# Patient Record
Sex: Male | Born: 1955 | Race: Black or African American | Hispanic: No | Smoking: Former smoker
Health system: Southern US, Community
[De-identification: ages and names within clinical notes are randomized; demographics above are authoritative.]

## PROBLEM LIST (undated history)

## (undated) HISTORY — PX: OTHER SURGICAL HISTORY: SHX169

---

## 1999-06-30 ENCOUNTER — Emergency Department (HOSPITAL_COMMUNITY): Admission: EM | Admit: 1999-06-30 | Discharge: 1999-06-30 | Payer: Self-pay | Admitting: Emergency Medicine

## 1999-12-23 ENCOUNTER — Encounter (INDEPENDENT_AMBULATORY_CARE_PROVIDER_SITE_OTHER): Payer: Self-pay | Admitting: *Deleted

## 1999-12-23 ENCOUNTER — Ambulatory Visit (HOSPITAL_BASED_OUTPATIENT_CLINIC_OR_DEPARTMENT_OTHER): Admission: RE | Admit: 1999-12-23 | Discharge: 1999-12-23 | Payer: Self-pay | Admitting: General Surgery

## 2002-03-21 ENCOUNTER — Emergency Department (HOSPITAL_COMMUNITY): Admission: EM | Admit: 2002-03-21 | Discharge: 2002-03-21 | Payer: Self-pay | Admitting: Emergency Medicine

## 2002-09-26 ENCOUNTER — Emergency Department (HOSPITAL_COMMUNITY): Admission: EM | Admit: 2002-09-26 | Discharge: 2002-09-26 | Payer: Self-pay | Admitting: Emergency Medicine

## 2003-10-01 ENCOUNTER — Emergency Department (HOSPITAL_COMMUNITY): Admission: EM | Admit: 2003-10-01 | Discharge: 2003-10-01 | Payer: Self-pay

## 2004-03-12 ENCOUNTER — Emergency Department (HOSPITAL_COMMUNITY): Admission: EM | Admit: 2004-03-12 | Discharge: 2004-03-12 | Payer: Self-pay | Admitting: Family Medicine

## 2004-03-12 ENCOUNTER — Emergency Department (HOSPITAL_COMMUNITY): Admission: EM | Admit: 2004-03-12 | Discharge: 2004-03-12 | Payer: Self-pay | Admitting: Emergency Medicine

## 2004-09-07 ENCOUNTER — Emergency Department (HOSPITAL_COMMUNITY): Admission: EM | Admit: 2004-09-07 | Discharge: 2004-09-07 | Payer: Self-pay | Admitting: Emergency Medicine

## 2004-12-27 ENCOUNTER — Emergency Department (HOSPITAL_COMMUNITY): Admission: EM | Admit: 2004-12-27 | Discharge: 2004-12-27 | Payer: Self-pay | Admitting: Emergency Medicine

## 2007-11-16 ENCOUNTER — Emergency Department (HOSPITAL_COMMUNITY): Admission: EM | Admit: 2007-11-16 | Discharge: 2007-11-17 | Payer: Self-pay | Admitting: Emergency Medicine

## 2010-05-19 ENCOUNTER — Emergency Department (HOSPITAL_COMMUNITY): Admission: EM | Admit: 2010-05-19 | Discharge: 2010-05-19 | Payer: Self-pay | Admitting: Emergency Medicine

## 2011-01-21 NOTE — Op Note (Signed)
[place]  Patient:    PAULINE, TRAINER                       MRN: [id number] Proc. Date: 12/23/99 Adm. Date:  [id number] Attending:  Fortino Sic                           Operative Report  PREOPERATIVE DIAGNOSIS:  Scalp mass near ear.  POSTOPERATIVE DIAGNOSIS:  Scalp mass near ear.  OPERATION PERFORMED:  Excision of large scalp mass, probable lipoma, left ear.  SURGEON:  Marnee Spring. Wiliam Ke, M.D.  ASSISTANT:  None.  ANESTHESIA:  Local MAC.  DESCRIPTION OF PROCEDURE:  With the patient well-sedated, the skin of the scalp  near the ear was prepped and draped in the usual manner.  The tissue was infiltrated with a mixture of Xylocaine and Marcaine anesthesia.  An incision was made covering the mass which was 4.5 cm long.  The mass which was felt to be a lipoma was dissected free from soft tissue.  Hemostasis was obtained with electrocautery current.  The wound was then closed with subcutaneous 3-0 Vicryl and subcuticular 4-0 Vicryl.  Steri-Strips were applied.  Estimated blood loss minimal. The patient received no blood and left the operating room in satisfactory condition after sponge and needle counts were verified. DD:  12/23/99 TD:  12/23/99 Job: [id number]

## 2011-05-30 LAB — POCT CARDIAC MARKERS
CKMB, poc: 2.1
Troponin i, poc: <0.05

## 2011-05-30 LAB — CBC
Hemoglobin: 15.1
MCHC: 34
MCV: 86.9
Platelets: 270
RDW: 14
RDW: 14.4
WBC: 6.1

## 2011-05-30 LAB — LIPASE, BLOOD: Lipase: 36

## 2011-05-30 LAB — COMPREHENSIVE METABOLIC PANEL
ALT: 20
AST: 26
Albumin: 4.2
Albumin: 4.3
Alkaline Phosphatase: 80
Calcium: 9.4
Chloride: 103
Creatinine, Ser: 1.12
GFR calc Af Amer: >60
GFR calc non Af Amer: >60
Glucose, Bld: 131 — ABNORMAL HIGH
Potassium: 4.3
Sodium: 138
Total Protein: 7.3

## 2011-05-30 LAB — DIFFERENTIAL
Basophils Relative: 0
Eosinophils Absolute: 0.1
Eosinophils Relative: 1
Lymphocytes Relative: 21
Lymphs Abs: 1.4
Monocytes Absolute: 0.5
Monocytes Absolute: 0.5
Neutro Abs: 4.2

## 2011-05-30 LAB — URINALYSIS, ROUTINE W REFLEX MICROSCOPIC
Bilirubin Urine: NEGATIVE
Nitrite: NEGATIVE
Protein, ur: NEGATIVE
Specific Gravity, Urine: 1.028
Urobilinogen, UA: 1

## 2011-05-30 LAB — URINE MICROSCOPIC-ADD ON

## 2012-03-16 ENCOUNTER — Encounter (HOSPITAL_COMMUNITY): Payer: Self-pay | Admitting: Emergency Medicine

## 2012-03-16 ENCOUNTER — Emergency Department (HOSPITAL_COMMUNITY)
Admission: EM | Admit: 2012-03-16 | Discharge: 2012-03-16 | Disposition: A | Discharge status: Home / Self Care | Payer: Self-pay | Attending: Emergency Medicine | Admitting: Emergency Medicine

## 2012-03-16 DIAGNOSIS — K645 Perianal venous thrombosis: Secondary | ICD-10-CM | POA: Insufficient documentation

## 2012-03-16 DIAGNOSIS — F17200 Nicotine dependence, unspecified, uncomplicated: Secondary | ICD-10-CM | POA: Insufficient documentation

## 2012-03-16 MED ORDER — OXYCODONE-ACETAMINOPHEN 5-325 MG PO TABS
1.0000 | ORAL_TABLET | Freq: Once | ORAL | Status: AC
  Administered 2012-03-16: 1 via ORAL
  Filled 2012-03-16: qty 1

## 2012-03-16 MED ORDER — DOCUSATE SODIUM 100 MG PO CAPS: 100.0000 mg | ORAL_CAPSULE | Freq: Two times a day (BID) | ORAL | Status: AC

## 2012-03-16 NOTE — ED Provider Notes (Signed)
History     CSN: [id number]  Arrival date & time 03/16/12  1135   First MD Initiated Contact with Patient 03/16/12 1230      Chief Complaint  Patient presents with  . Abscess    (Consider location/radiation/quality/duration/timing/severity/associated sxs/prior treatment) HPI History from patient. 56 year old male who presents with possible abscess near his rectum. He states this has been present for about a week now and is gradually worsening and becoming more painful. He has not noticed any drainage from the area. States the area seems to be increasing in size it makes it difficult to have a bowel movement. He has not had any fever or chills. He has never had anything like this before. No history of abscesses which have required drainage in the past. Denies abdominal pain, nausea, vomiting, diarrhea.    History reviewed. No pertinent past medical history.  History reviewed. No pertinent past surgical history.  History reviewed. No pertinent family history.  History  Substance Use Topics  . Smoking status: Current Everyday Smoker  . Smokeless tobacco: Not on file  . Alcohol Use: Yes      Review of Systems  Constitutional: Negative.   Gastrointestinal: Negative for nausea, vomiting, abdominal pain and diarrhea.  Skin: Positive for wound.    Allergies  Penicillins  Home Medications  No current outpatient prescriptions on file.  BP 108/68  Pulse 66  Temp 98.3 F (36.8 C) (Oral)  Resp 18  SpO2 97%  Physical Exam  Nursing note and vitals reviewed. Constitutional: He appears well-developed and well-nourished. No distress.  HENT:  Head: Normocephalic and atraumatic.  Neck: Normal range of motion.  Cardiovascular: Normal rate.   Pulmonary/Chest: Effort normal.  Abdominal: Soft. Bowel sounds are normal. There is no tenderness. There is no rebound and no guarding.  Genitourinary:       RN chaperone present during exam. Large thrombosed external hemorrhoid at  5:00. No active bleeding or discharge noted. Area is exquisitely tender to palpation.  Musculoskeletal: Normal range of motion.  Neurological: He is alert.  Skin: Skin is warm and dry. He is not diaphoretic.  Psychiatric: He has a normal mood and affect.    ED Course  Procedures (including critical care time) INCISION AND DRAINAGE Performed by: Grant Fontana Consent: Verbal consent obtained. Risks and benefits: risks, benefits and alternatives were discussed Type: Thrombosed hemorrhoid   Body area: Rectum  Anesthesia: local infiltration  Local anesthetic: lidocaine 2 % with epinephrine  Anesthetic total: 1.5 ml  Complexity: complex Blunt dissection to break up loculations  Drainage: Bloody with clots   Drainage amount: Moderate   Patient tolerance: Patient tolerated the procedure well with no immediate complications.     Labs Reviewed - No data to display No results found.   1. Thrombosed external hemorrhoid       MDM  Patient presents with what appears to be a large thrombosed external hemorrhoid. The area was incised and drained which the patient tolerated relatively well. He was instructed on sitz baths, and using stool softeners to prevent recurrence/new hemorrhoids. Reasons to return discussed. Patient verbalized understanding and agreed to plan.         Grant Fontana, [place] 03/17/12 [phone]

## 2012-03-16 NOTE — ED Notes (Signed)
Pt states he has a abscess between is buttock. Pt states he has had it for about a week and yesterday is when the pain increased. Pt states currently is pain is a 4. Pt states " It feels hard and its pretty big". Pt states he has not taken anything for pain. Pt states "I don"t have any problems urinating but difficulty when I have a bowel movement because it's kinda over my rectum.

## 2012-03-16 NOTE — ED Notes (Signed)
Pt c/o rectal abscess; pt unsure of exact location but thinks drainage coming from rectum; pt having pain

## 2012-03-17 NOTE — ED Provider Notes (Signed)
Medical screening examination/treatment/procedure(s) were performed by non-physician practitioner and as supervising physician I was immediately available for consultation/collaboration.  Doug Sou, MD 03/17/12 [phone]

## 2014-09-17 ENCOUNTER — Emergency Department (HOSPITAL_COMMUNITY): Payer: Self-pay

## 2014-09-17 ENCOUNTER — Encounter (HOSPITAL_COMMUNITY): Payer: Self-pay | Admitting: Emergency Medicine

## 2014-09-17 ENCOUNTER — Emergency Department (HOSPITAL_COMMUNITY)
Admission: EM | Admit: 2014-09-17 | Discharge: 2014-09-17 | Disposition: A | Discharge status: Home / Self Care | Payer: Self-pay | Attending: Emergency Medicine | Admitting: Emergency Medicine

## 2014-09-17 DIAGNOSIS — J209 Acute bronchitis, unspecified: Secondary | ICD-10-CM | POA: Insufficient documentation

## 2014-09-17 DIAGNOSIS — Z720 Tobacco use: Secondary | ICD-10-CM | POA: Insufficient documentation

## 2014-09-17 LAB — CBG MONITORING, ED: GLUCOSE-CAPILLARY: 81 mg/dL (ref 70–99)

## 2014-09-17 MED ORDER — FLUTICASONE PROPIONATE 50 MCG/ACT NA SUSP: 2.0000 | Freq: Every day | NASAL | Status: DC

## 2014-09-17 MED ORDER — AZITHROMYCIN 250 MG PO TABS: ORAL_TABLET | ORAL | Status: DC

## 2014-09-17 NOTE — ED Notes (Signed)
Pt states when he coughs he gets a pain in his chest. Denies pain unless coughing

## 2014-09-17 NOTE — ED Notes (Signed)
CBG 81.  

## 2014-09-17 NOTE — Discharge Instructions (Signed)
Use nasal saline (you can try Arm and Hammer Simply Saline) at least 4 times a day, use saline 5-10 minutes before using the fluticasone (flonase)  Do not use Afrin (Oxymetazoline)  Rest, wash hands frequently  and drink plenty of water.  You may want to try an over-the-counter medication like sudafed and or mucinex  Do not hesitate to return to the emergency room for any new, worsening or concerning symptoms.  Please obtain primary care using resource guide below. But the minute you were seen in the emergency room and that they will need to obtain records for further outpatient management.   Acute Bronchitis Bronchitis is inflammation of the airways that extend from the windpipe into the lungs (bronchi). The inflammation often causes mucus to develop. This leads to a cough, which is the most common symptom of bronchitis.  In acute bronchitis, the condition usually develops suddenly and goes away over time, usually in a couple weeks. Smoking, allergies, and asthma can make bronchitis worse. Repeated episodes of bronchitis may cause further lung problems.  CAUSES Acute bronchitis is most often caused by the same virus that causes a cold. The virus can spread from person to person (contagious) through coughing, sneezing, and touching contaminated objects. SIGNS AND SYMPTOMS   Cough.   Fever.   Coughing up mucus.   Body aches.   Chest congestion.   Chills.   Shortness of breath.   Sore throat.  DIAGNOSIS  Acute bronchitis is usually diagnosed through a physical exam. Your health care provider will also ask you questions about your medical history. Tests, such as chest X-rays, are sometimes done to rule out other conditions.  TREATMENT  Acute bronchitis usually goes away in a couple weeks. Oftentimes, no medical treatment is necessary. Medicines are sometimes given for relief of fever or cough. Antibiotic medicines are usually not needed but may be prescribed in certain  situations. In some cases, an inhaler may be recommended to help reduce shortness of breath and control the cough. A cool mist vaporizer may also be used to help thin bronchial secretions and make it easier to clear the chest.  HOME CARE INSTRUCTIONS  Get plenty of rest.   Drink enough fluids to keep your urine clear or pale yellow (unless you have a medical condition that requires fluid restriction). Increasing fluids may help thin your respiratory secretions (sputum) and reduce chest congestion, and it will prevent dehydration.   Take medicines only as directed by your health care provider.  If you were prescribed an antibiotic medicine, finish it all even if you start to feel better.  Avoid smoking and secondhand smoke. Exposure to cigarette smoke or irritating chemicals will make bronchitis worse. If you are a smoker, consider using nicotine gum or skin patches to help control withdrawal symptoms. Quitting smoking will help your lungs heal faster.   Reduce the chances of another bout of acute bronchitis by washing your hands frequently, avoiding people with cold symptoms, and trying not to touch your hands to your mouth, nose, or eyes.   Keep all follow-up visits as directed by your health care provider.  SEEK MEDICAL CARE IF: Your symptoms do not improve after 1 week of treatment.  SEEK IMMEDIATE MEDICAL CARE IF:  You develop an increased fever or chills.   You have chest pain.   You have severe shortness of breath.  You have bloody sputum.   You develop dehydration.  You faint or repeatedly feel like you are going to pass out.  You develop repeated vomiting.  You develop a severe headache. MAKE SURE YOU:   Understand these instructions.  Will watch your condition.  Will get help right away if you are not doing well or get worse. Document Released: 09/29/2004 Document Revised: 01/06/2014 Document Reviewed: 02/12/2013 [facility] Patient Information 2015  [place], [place]. This information is not intended to replace advice given to you by your health care provider. Make sure you discuss any questions you have with your health care provider.   Emergency Department Resource Guide 1) Find a Doctor and Pay Out of Pocket Although you won't have to find out who is covered by your insurance plan, it is a good idea to ask around and get recommendations. You will then need to call the office and see if the doctor you have chosen will accept you as a new patient and what types of options they offer for patients who are self-pay. Some doctors offer discounts or will set up payment plans for their patients who do not have insurance, but you will need to ask so you aren't surprised when you get to your appointment.  2) Contact Your Local Health Department Not all health departments have doctors that can see patients for sick visits, but many do, so it is worth a call to see if yours does. If you don't know where your local health department is, you can check in your phone book. The CDC also has a tool to help you locate your state's health department, and many state websites also have listings of all of their local health departments.  3) Find a Walk-in Clinic If your illness is not likely to be very severe or complicated, you may want to try a walk in clinic. These are popping up all over the country in pharmacies, drugstores, and shopping centers. They're usually staffed by nurse practitioners or physician assistants that have been trained to treat common illnesses and complaints. They're usually fairly quick and inexpensive. However, if you have serious medical issues or chronic medical problems, these are probably not your best option.  No Primary Care Doctor: - Call Health Connect at  [phone] - they can help you locate a primary care doctor that  accepts your insurance, provides certain services, etc. - Physician Referral Service- [phone]  Chronic Pain  Problems: Organization         Address  Phone   Notes  [facility]  [phone] Patients need to be referred by their primary care doctor.   Medication Assistance: Organization         Address  Phone   Notes  [facility] Medication [facility] [address] [place] [place], [place] [address] [phone] --Must be a resident of [facility] -- Must have NO insurance coverage whatsoever (no Medicaid/ Medicare, etc.) -- The pt. MUST have a primary care doctor that directs their care regularly and follows them in the community   [facility]ist  [phone]   [employer]  [phone]    Agencies that provide inexpensive medical care: Organization         Address  Phone   Notes  [facility]  [phone]   [facility]    [phone]   [facility] [address] [place], [place] [address] [phone]   [facility] [address] [place]. [address] [place] [phone]   Planned Parenthood    [phone]   [facility]    [phone]   [facility]  [address], [place] Phone:  [phone], Fax:  [phone] Hours of Operation:  9 am - 6 pm, M-F.  Also accepts Medicaid/Medicare and self-pay.  [facility]  [address], [place] Phone: [phone], Fax: [phone]. Hours of Operation:  8:30 am - 5:30 pm, M-F.  Also accepts Medicaid and self-pay.  [facility] [place] [address], [place] Phone: [phone]   [facility] [address] Natasha Bence [place], [place] [phone], Ext. [phone] Mondays & Thursdays: 7-9 AM.  First 15 patients are seen on a first come, first serve basis.    Medicaid-accepting [facility] Providers:  Organization         Address  Phone   Notes  [facility] [address], [place] [phone] Also  accepts self-pay patients.  [facility] [address] Laurell Josephs [place], [place]  [phone]   [facility] [address], [place] [phone]   [facility] [address], [place] [phone]   Renaye Rakers [address], [place]   [phone] Only accepts [place] patients after they have their name applied to their card.   Self-Pay (no insurance) in [facility]:  Organization         Address  Phone   Notes  Sickle Cell Patients, [facility] [address] [place], [place] [phone]   [facility] [address] [place], [place] [phone]   [facility]  [address], [place] [phone]   [facility]/Dr. Osei-Bonsu  [address] [place] or [address], [place] [phone] Phone number for both [place] and [place] locations is the same.  [facility] [address] [place] [phone]   [facility] [address] [place] or [address] [phone]   [facility] [address] [place] [phone], phone; [phone], fax Sees patients 1st and 3rd Saturday of every month.  Must not qualify for public or private insurance (i.e. Medicaid, Medicare, [place] Health Choice, Veterans' Benefits)  Household income should be no more than 200% of the poverty level The clinic cannot treat you if you are pregnant or think you are pregnant  Sexually transmitted diseases are not treated at the clinic.    Dental Care: Organization         Address  Phone  Notes  [facility] [address] [place], [place] [phone] Accepts children up to age 67 who are enrolled in [place] or [place] Health Choice; pregnant  women with a Medicaid card; and children who have applied for Medicaid or [place] Health Choice, but were declined, whose parents can pay a reduced fee at time of service.  [facility] of [facility]  [address], [place] [phone] Accepts children up to age 88 who are enrolled in [place] or [place] Health Choice; pregnant women with a Medicaid card; and children who have applied for Medicaid or [place] Health Choice, but were declined, whose parents can pay a reduced fee at time of  service.  [facility]  [address] [place], [place] [phone] Patients are seen by appointment only. Walk-ins are not accepted. [facility] will see patients 42 years of age and older. Monday - Tuesday (8am-5pm) Most Wednesdays (8:30-5pm) $30 per visit, cash only  [facility] Adult [facility] Access PRO[facility]  [address], [facility] [phone] Patients are seen by appointment only. Walk-ins are not accepted. [facility] will see patients 38 years of age and older. One Wednesday Evening (Monthly: Volunteer Based).  $30 per visit, cash only  [employer]  [phone] for adults; Children under age 74, call Graduate Pediatric Dentistry at [phone]. Children aged 39-14, please call [phone] to request a pediatric application.  Dental services are provided in all areas of dental care including fillings, crowns and bridges, complete and partial dentures, implants, gum treatment, root canals, and extractions. Preventive care is also provided. Treatment is provided to both adults and children. Patients are selected via a lottery and there is often a waiting list.   [facility] [address] [place]  [phone] [email or web address]   [facility] [address] [place], [place] [phone], Ext. [phone] Second and Fourth Thursday of each month, opens at 6:30 AM; Clinic ends at 9 AM.  Patients are  seen on a first-come first-served basis, and a limited number are seen during each clinic.   [facility]  [address] Ether Griffins [place], [place] [phone]   Eligibility Requirements You must have lived in [place], [place], or [place] counties for at least the last three months.   You cannot be eligible for state or federal sponsored [place], including [employer], [place], or [employer].   You generally cannot be eligible for healthcare insurance through your employer.    How to apply: Eligibility screenings are held every Tuesday and Wednesday afternoon from 1:00 pm until 4:00 pm. You do not need an appointment for the interview!  [facility] [address], [place], [place] [phone]   [facility] Health Department  [phone]   [facility] Health Department  [phone]   [facility] Health Department  [phone]    Behavioral Health Resources in the Community: Intensive Outpatient Programs Organization         Address  Phone  Notes  [facility] [address] [place], [place] [phone]   [facility] Outpatient [address], [place], [place] [phone]   [facility] [address], [place], [place]  [phone]   [facility] Mental Health [address]  [place], [place] [phone] or [phone]   Substance Abuse Resources Organization         Address  Phone  Notes  [facility] Drug Services  [phone]   [facility]  [phone]   [facility] [place]  [phone]   Floydene Flock  [phone]   Residential & Outpatient Substance Abuse Program  [phone]   Psychological Services Organization         Address  Phone  Notes  [facility] Behavioral Health  [phone]   [facility]  [phone]   [facility] Mental Health [address], [place] [phone] or [phone]    Mobile Crisis  Teams Organization         Address  Phone  Notes  [facility], Mobile Crisis Care Unit  [phone]   [facility]  [address] [place], [place] [phone]   [facility]sch [address] [place] [phone]    Self-Help/Support Groups Organization         Address  Phone             Notes  [facility] [place] - variety of support groups  336- [phone] Call for more information  Narcotics Anonymous (NA), [facility] [address], [place]  2 meetings at this location   Statisticianesidential Treatment Programs Organization         Address  Phone  Notes  [facility] [address],    [place]  [phone]   [facility]  [address], [place] [phone], [place], [place] [phone]   [facility] [address], [place] [phone] Admissions: 8am-3pm M-F  [facility] [address]    [place], [place] [phone]   [facility] [address], [place], [place] [phone]   [facility] [address]  [place], [place] [phone]   [facility] - Intensive Outpatient [address], [place], [place] [phone]   [facility] ([facility] [address]  [place], [place] [phone] or [phone]   [facility] ([facility]) [address] [place], [place] [phone] Accepts Medicaid  [facility] [address]  [place] [phone] Substance Abuse/Addiction Treatment   [facility] Behavioral Health Resources Organization         Address  Phone  Notes  [facility]  [phone]) [phone]   Angie FavaJulie Brannon, PhD [address] [place], [place]   [phone](336) [phone] or [phone]   [facility]    [address] [place], [place] [phone]) [phone]   [facility] [address], [place], [place] [phone](336) [phone]  Insurance/Medicaid/sponsorship through [facility]  [facility] [address]                                    [place], [place] [phone]) [phone] Therapy/tele-psych/case  [facility] [address]   [place], [place] [phone]    Dr. Lolly MustacheArfeen  [phone]   [facility] [place]  [employer] [facility] 1) [address], [place] 2) [address], [place] 3)  [address], [place] [phone]) [phone](336) [phone]  [phone]   [facility] Child Abuse Hotline [phone]) [phone] or [phone] (After Hours)

## 2014-09-17 NOTE — ED Notes (Signed)
Pt sts pain in right side with cough and requests CBG check

## 2014-09-17 NOTE — ED Provider Notes (Signed)
CSN: [id number]     Arrival date & time 09/17/14  1451 History  This chart was scribed for non-physician practitioner working with Thomas SouSam Jacubowitz, MD by Thomas Hinton, ED Scribe. This patient was seen in room TR09C/TR09C and the patient's care was started at 4:46 PM.    Chief Complaint  Patient presents with  . Cough   The history is provided by the patient. No language interpreter was used.   HPI Comments: Thomas Hinton is a 59 y.o. male who presents to the Emergency Department complaining of a dry cough that started 3 days ago. He states that his cough was productive with phlegm at onset 3 days ago. He states that he coughs more frequently at night. Pt reports associated rhinorrhea and congestion. Pt states that his cough started to cause him CP. He denies any pain unless he is coughing. Pt reports he smoked 1ppd for 20 years but quit a year ago. Denies fever, chills and SOB.    Pt also wants to get his checked to see if he has diabetes because he reports a family history of diabetes.   CBG is not coming through on computer. CBG is 81.   History reviewed. No pertinent past medical history. History reviewed. No pertinent past surgical history. History reviewed. No pertinent family history. History  Substance Use Topics  . Smoking status: Current Every Day Smoker  . Smokeless tobacco: Not on file  . Alcohol Use: Yes    Review of Systems  A complete 10 system review of systems was obtained and all systems are negative except as noted in the HPI and PMH.    Allergies  Penicillins  Home Medications   Prior to Admission medications   Not on File   BP 111/69 mmHg  Pulse 64  Temp(Src) 97.9 F (36.6 C)  Resp 16  SpO2 98% Physical Exam  Constitutional: He is oriented to person, place, and time. He appears well-developed and well-nourished.  HENT:  Head: Normocephalic and atraumatic.  Mouth/Throat: Oropharynx is clear and moist.  No drooling or stridor. Posterior pharynx  mildly erythematous no significant tonsillar hypertrophy. No exudate. Soft palate rises symmetrically. No TTP or induration under tongue.   No tenderness to palpation of frontal or bilateral maxillary sinuses.  No mucosal edema in the nares.  Bilateral tympanic membranes with normal architecture and good light reflex.    Eyes: Pupils are equal, round, and reactive to light. Right eye exhibits no discharge. Left eye exhibits no discharge.  Neck: Normal range of motion. Neck supple. No tracheal deviation present.  Cardiovascular: Normal rate, regular rhythm and intact distal pulses.   Pulmonary/Chest: Effort normal and breath sounds normal. No respiratory distress. He has no wheezes. He has no rales. He exhibits no tenderness.  Abdominal: Soft. Bowel sounds are normal. He exhibits no distension and no mass. There is no tenderness. There is no rebound and no guarding.  Neurological: He is alert and oriented to person, place, and time.  Skin: Skin is warm and dry.  Psychiatric: He has a normal mood and affect. His behavior is normal.  Nursing note and vitals reviewed.   ED Course  Procedures   DIAGNOSTIC STUDIES: Oxygen Saturation is 98% on RA, normal by my interpretation.    COORDINATION OF CARE: 4:49 PM Discussed treatment plan with pt at bedside and pt agreed to plan.   Labs Review Labs Reviewed  CBG MONITORING, ED    Imaging Review Dg Chest 2 View (if Patient Has Fever And/or  Copd)  09/17/2014   CLINICAL DATA:  Cough for 2-3 days  EXAM: CHEST  2 VIEW  COMPARISON:  11/16/2007  FINDINGS: The heart size and mediastinal contours are within normal limits. Both lungs are clear. Thoracic scoliosis deformity noted.  IMPRESSION: No active cardiopulmonary disease.   Electronically Signed   By: Thomas Hinton M.D.   On: 09/17/2014 15:32     EKG Interpretation None      MDM   Final diagnoses:  Acute bronchitis, unspecified organism    Filed Vitals:   09/17/14 1507 09/17/14 1707   BP: 111/69 103/75  Pulse: 64 66  Temp: 97.9 F (36.6 C) 97.8 F (36.6 C)  TempSrc:  Oral  Resp: 16 16  SpO2: 98% 98%    Nole Robey is a pleasant 59 y.o. male presenting with cough and pleuritic chest pain. States the cough was initially productive but then it dried. Patient has an extensive smoking history, lung sounds are clear to auscultation, saturating well on room air, chest x-rays without infiltrate, we'll treat him for an acute bronchitis and considering he is a smoker with sputum change will start him on antibiotics as well.  Evaluation does not show pathology that would require ongoing emergent intervention or inpatient treatment. Pt is hemodynamically stable and mentating appropriately. Discussed findings and plan with patient/guardian, who agrees with care plan. All questions answered. Return precautions discussed and outpatient follow up given.   Discharge Medication List as of 09/17/2014  5:03 PM    START taking these medications   Details  azithromycin (ZITHROMAX Z-PAK) 250 MG tablet 2 po day one, then 1 daily x 4 days, Print    fluticasone (FLONASE) 50 MCG/ACT nasal spray Place 2 sprays into both nostrils daily., Starting 09/17/2014, Until Discontinued, Print        I personally performed the services described in this documentation, which was scribed in my presence. The recorded information has been reviewed and is accurate.      Thomas Emery, PA-C 09/17/14 [id number]  Thomas Sou, MD 09/18/14 [phone]

## 2014-09-18 LAB — CBG MONITORING, ED: GLUCOSE-CAPILLARY: 82 mg/dL (ref 70–99)

## 2015-09-08 ENCOUNTER — Encounter (HOSPITAL_COMMUNITY): Payer: Self-pay | Admitting: Emergency Medicine

## 2015-09-08 ENCOUNTER — Emergency Department (HOSPITAL_COMMUNITY)
Admission: EM | Admit: 2015-09-08 | Discharge: 2015-09-08 | Disposition: A | Discharge status: Home / Self Care | Payer: Self-pay | Attending: Emergency Medicine | Admitting: Emergency Medicine

## 2015-09-08 DIAGNOSIS — Z880 Allergy status to penicillin: Secondary | ICD-10-CM | POA: Insufficient documentation

## 2015-09-08 DIAGNOSIS — F17210 Nicotine dependence, cigarettes, uncomplicated: Secondary | ICD-10-CM | POA: Insufficient documentation

## 2015-09-08 DIAGNOSIS — K2970 Gastritis, unspecified, without bleeding: Secondary | ICD-10-CM | POA: Insufficient documentation

## 2015-09-08 LAB — CBC
HCT: 46.6 % (ref 39.0–52.0)
HEMOGLOBIN: 16 g/dL (ref 13.0–17.0)
MCH: 29.6 pg (ref 26.0–34.0)
MCHC: 34.3 g/dL (ref 30.0–36.0)
MCV: 86.3 fL (ref 78.0–100.0)
PLATELETS: 258 10*3/uL (ref 150–400)
RBC: 5.4 MIL/uL (ref 4.22–5.81)
RDW: 14.4 % (ref 11.5–15.5)
WBC: 6.5 10*3/uL (ref 4.0–10.5)

## 2015-09-08 LAB — URINE MICROSCOPIC-ADD ON: WBC UA: NONE SEEN WBC/hpf (ref 0–5)

## 2015-09-08 LAB — COMPREHENSIVE METABOLIC PANEL
ALBUMIN: 3.5 g/dL (ref 3.5–5.0)
ALK PHOS: 68 U/L (ref 38–126)
ALT: 26 U/L (ref 17–63)
ANION GAP: 9 (ref 5–15)
AST: 33 U/L (ref 15–41)
BILIRUBIN TOTAL: 1.2 mg/dL (ref 0.3–1.2)
BUN: 7 mg/dL (ref 6–20)
CALCIUM: 9.1 mg/dL (ref 8.9–10.3)
CO2: 25 mmol/L (ref 22–32)
CREATININE: 1.12 mg/dL (ref 0.61–1.24)
Chloride: 107 mmol/L (ref 101–111)
GFR calc Af Amer: >60 mL/min (ref >60)
GFR calc non Af Amer: >60 mL/min (ref >60)
GLUCOSE: 108 mg/dL — AB (ref 65–99)
Potassium: 3.9 mmol/L (ref 3.5–5.1)
Sodium: 141 mmol/L (ref 135–145)
TOTAL PROTEIN: 6.3 g/dL — AB (ref 6.5–8.1)

## 2015-09-08 LAB — URINALYSIS, ROUTINE W REFLEX MICROSCOPIC
BILIRUBIN URINE: NEGATIVE
Glucose, UA: NEGATIVE mg/dL
KETONES UR: 40 mg/dL — AB
Leukocytes, UA: NEGATIVE
NITRITE: NEGATIVE
PROTEIN: NEGATIVE mg/dL
Specific Gravity, Urine: 1.023 (ref 1.005–1.030)
pH: 6 (ref 5.0–8.0)

## 2015-09-08 LAB — LIPASE, BLOOD: Lipase: 36 U/L (ref 11–51)

## 2015-09-08 MED ORDER — OMEPRAZOLE 20 MG PO CPDR: 20.0000 mg | DELAYED_RELEASE_CAPSULE | Freq: Every day | ORAL | Status: DC

## 2015-09-08 MED ORDER — ONDANSETRON HCL 4 MG PO TABS
4.0000 mg | ORAL_TABLET | Freq: Once | ORAL | Status: AC
  Administered 2015-09-08: 4 mg via ORAL
  Filled 2015-09-08: qty 1

## 2015-09-08 MED ORDER — ONDANSETRON HCL 4 MG PO TABS: 4.0000 mg | ORAL_TABLET | Freq: Four times a day (QID) | ORAL | Status: DC

## 2015-09-08 MED ORDER — GI COCKTAIL ~~LOC~~
30.0000 mL | Freq: Once | ORAL | Status: AC
  Administered 2015-09-08: 30 mL via ORAL
  Filled 2015-09-08: qty 30

## 2015-09-08 NOTE — Discharge Instructions (Signed)

## 2015-09-08 NOTE — ED Notes (Signed)
Pt states for the last 2 days has had intermittent left mid abd pain that comes and goes. Pt states he vomittied once this am. Denies and diarrhea.

## 2015-09-08 NOTE — ED Provider Notes (Signed)
CSN: [id number]     Arrival date & time 09/08/15  1623 History  By signing my name below, I, Thomas Hinton, attest that this documentation has been prepared under the direction and in the presence of [employer], PA-C. Electronically Signed: Placido Hinton, ED Scribe. 09/08/2015. 6:21 PM.   Chief Complaint  Patient presents with  . Abdominal Pain   The history is provided by the patient. No language interpreter was used.    HPI Comments: Thomas Hinton is a 60 y.o. male who presents to the Emergency Department complaining of intermittent, moderate, epigastric pain with onset 3 days ago. He notes associated nausea, 2x vomiting, mild dizziness and intermittent paraesthesia in his toes. He notes 1 BM this morning which was less than nml. Pt notes worsening pain during and immediately following eating. Pt denies taking any medications for his symptoms. He denies a hx of DM. Pt denies diarrhea, fever, chills, CP, SOB, urinary issues and diaphoresis.   PCP: None  History reviewed. No pertinent past medical history. History reviewed. No pertinent past surgical history. No family history on file. Social History  Substance Use Topics  . Smoking status: Current Every Day Smoker -- 0.50 packs/day    Types: Cigarettes  . Smokeless tobacco: None  . Alcohol Use: Yes    Review of Systems  All other systems reviewed and are negative.   Allergies  Penicillins  Home Medications   Prior to Admission medications   Medication Sig Start Date End Date Taking? Authorizing Provider  aspirin 325 MG tablet Take 325 mg by mouth every 6 (six) hours as needed for mild pain.   Yes Historical Provider, MD  azithromycin (ZITHROMAX Z-PAK) 250 MG tablet 2 po day one, then 1 daily x 4 days Patient not taking: Reported on 09/08/2015 09/17/14   Joni Reining Pisciotta, PA-C  fluticasone (FLONASE) 50 MCG/ACT nasal spray Place 2 sprays into both nostrils daily. Patient not taking: Reported on 09/08/2015 09/17/14   Joni Reining  Pisciotta, PA-C  omeprazole (PRILOSEC) 20 MG capsule Take 1 capsule (20 mg total) by mouth daily. 09/08/15   Tinnie Gens Paz Winsett, PA-C  ondansetron (ZOFRAN) 4 MG tablet Take 1 tablet (4 mg total) by mouth every 6 (six) hours. 09/08/15   Ysabelle Goodroe, PA-C   BP 120/74 mmHg  Pulse 62  Temp(Src) 98.1 F (36.7 C) (Oral)  Resp 16  Ht 5\' 9"  (1.753 m)  Wt 86.183 kg  BMI 28.05 kg/m2  SpO2 94%    Physical Exam  Constitutional: He is oriented to person, place, and time. He appears well-developed and well-nourished.  HENT:  Head: Normocephalic and atraumatic.  Mouth/Throat: No oropharyngeal exudate.  Neck: Normal range of motion.  Cardiovascular: Normal rate.   Pulmonary/Chest: Effort normal. No respiratory distress.  Abdominal: Soft. He exhibits no distension and no mass. There is tenderness. There is no rebound and no guarding.  Very minimal TTP of epigastric region  Musculoskeletal: Normal range of motion.  Neurological: He is alert and oriented to person, place, and time.  Skin: Skin is warm and dry. He is not diaphoretic.  Psychiatric: He has a normal mood and affect. His behavior is normal.  Nursing note and vitals reviewed.   ED Course  Procedures  DIAGNOSTIC STUDIES: Oxygen Saturation is 94% on RA, adequate by my interpretation.    COORDINATION OF CARE: 6:17 PM Pt presents today due to abd pain. Discussed next steps including Zofran, GI cocktail and reevaluation. Pt understood and is agreeable to the plan.   Labs Review  Labs Reviewed  COMPREHENSIVE METABOLIC PANEL - Abnormal; Notable for the following:    Glucose, Bld 108 (*)    Total Protein 6.3 (*)    All other components within normal limits  URINALYSIS, ROUTINE W REFLEX MICROSCOPIC (NOT AT [facility]) - Abnormal; Notable for the following:    Hgb urine dipstick SMALL (*)    Ketones, ur 40 (*)    All other components within normal limits  URINE MICROSCOPIC-ADD ON - Abnormal; Notable for the following:    Squamous Epithelial /  LPF 0-5 (*)    Bacteria, UA RARE (*)    All other components within normal limits  LIPASE, BLOOD  CBC    Imaging Review No results found. I have personally reviewed and evaluated these lab results as part of my medical decision-making.   EKG Interpretation None      MDM   Final diagnoses:  Gastritis    Labs: UA, urine microscopic, lipase, CMP and CBC  Imaging: none indicated  Consults: none  Therapeutics: Zofran, GI cocktail   Assessment:  Plan: Patient's presentation most consistent with gastritis. Symptoms improved with GI cocktail. No significant abdominal tenderness, patient is afebrile, nontoxic with reassuring vital signs. Patient will be discharged home on Prilosec, primary care follow-up. he Pt given strict return precautions, verbalized understanding and agreement to today's plan and had no further questions or concerns at the time of discharge.    I personally performed the services described in this documentation, which was scribed in my presence. The recorded information has been reviewed and is accurate.    Eyvonne MechanicJeffrey Jernee Murtaugh, PA-C 09/08/15 [phone]  Benjiman CoreNathan Pickering, MD 09/08/15 2312

## 2017-01-05 ENCOUNTER — Encounter (HOSPITAL_COMMUNITY): Payer: Self-pay

## 2017-01-05 ENCOUNTER — Emergency Department (HOSPITAL_COMMUNITY): Payer: Self-pay

## 2017-01-05 ENCOUNTER — Inpatient Hospital Stay (HOSPITAL_COMMUNITY)
Admission: EM | Admit: 2017-01-05 | Discharge: 2017-01-07 | DRG: 313 | Disposition: A | Discharge status: Home / Self Care | Payer: Self-pay | Attending: Internal Medicine | Admitting: Internal Medicine

## 2017-01-05 DIAGNOSIS — Z8249 Family history of ischemic heart disease and other diseases of the circulatory system: Secondary | ICD-10-CM

## 2017-01-05 DIAGNOSIS — Z880 Allergy status to penicillin: Secondary | ICD-10-CM

## 2017-01-05 DIAGNOSIS — R9439 Abnormal result of other cardiovascular function study: Secondary | ICD-10-CM | POA: Diagnosis not present

## 2017-01-05 DIAGNOSIS — R079 Chest pain, unspecified: Secondary | ICD-10-CM | POA: Diagnosis present

## 2017-01-05 DIAGNOSIS — F17210 Nicotine dependence, cigarettes, uncomplicated: Secondary | ICD-10-CM | POA: Diagnosis present

## 2017-01-05 DIAGNOSIS — R030 Elevated blood-pressure reading, without diagnosis of hypertension: Secondary | ICD-10-CM | POA: Diagnosis present

## 2017-01-05 DIAGNOSIS — R0789 Other chest pain: Principal | ICD-10-CM | POA: Diagnosis present

## 2017-01-05 DIAGNOSIS — R109 Unspecified abdominal pain: Secondary | ICD-10-CM | POA: Diagnosis present

## 2017-01-05 LAB — BASIC METABOLIC PANEL
ANION GAP: 5 (ref 5–15)
BUN: 15 mg/dL (ref 6–20)
CHLORIDE: 111 mmol/L (ref 101–111)
CO2: 25 mmol/L (ref 22–32)
Calcium: 8.2 mg/dL — ABNORMAL LOW (ref 8.9–10.3)
Creatinine, Ser: 1.08 mg/dL (ref 0.61–1.24)
GFR calc Af Amer: >60 mL/min (ref >60)
GFR calc non Af Amer: >60 mL/min (ref >60)
GLUCOSE: 85 mg/dL (ref 65–99)
POTASSIUM: 3.9 mmol/L (ref 3.5–5.1)
Sodium: 141 mmol/L (ref 135–145)

## 2017-01-05 LAB — CBC
HCT: 41.5 % (ref 39.0–52.0)
HEMOGLOBIN: 14.2 g/dL (ref 13.0–17.0)
MCH: 28.6 pg (ref 26.0–34.0)
MCHC: 34.2 g/dL (ref 30.0–36.0)
MCV: 83.5 fL (ref 78.0–100.0)
Platelets: 283 10*3/uL (ref 150–400)
RBC: 4.97 MIL/uL (ref 4.22–5.81)
RDW: 14.2 % (ref 11.5–15.5)
WBC: 5.9 10*3/uL (ref 4.0–10.5)

## 2017-01-05 LAB — I-STAT TROPONIN, ED: TROPONIN I, POC: 0 ng/mL (ref 0.00–0.08)

## 2017-01-05 LAB — TROPONIN I: Troponin I: <0.03 ng/mL (ref <0.03)

## 2017-01-05 LAB — LIPASE, BLOOD: LIPASE: 38 U/L (ref 11–51)

## 2017-01-05 LAB — TSH: TSH: 2.642 u[IU]/mL (ref 0.350–4.500)

## 2017-01-05 MED ORDER — ASPIRIN EC 325 MG PO TBEC
325.0000 mg | DELAYED_RELEASE_TABLET | Freq: Every day | ORAL | Status: DC
  Administered 2017-01-06 – 2017-01-07 (×2): 325 mg via ORAL
  Filled 2017-01-05 (×2): qty 1

## 2017-01-05 MED ORDER — MORPHINE SULFATE (PF) 4 MG/ML IV SOLN: 2.0000 mg | INTRAVENOUS | Status: DC | PRN

## 2017-01-05 MED ORDER — ACETAMINOPHEN 325 MG PO TABS: 650.0000 mg | ORAL_TABLET | ORAL | Status: DC | PRN

## 2017-01-05 MED ORDER — HYDRALAZINE HCL 20 MG/ML IJ SOLN: 5.0000 mg | Freq: Four times a day (QID) | INTRAMUSCULAR | Status: DC | PRN

## 2017-01-05 MED ORDER — ENOXAPARIN SODIUM 40 MG/0.4ML ~~LOC~~ SOLN
40.0000 mg | SUBCUTANEOUS | Status: DC
  Administered 2017-01-05 – 2017-01-06 (×2): 40 mg via SUBCUTANEOUS
  Filled 2017-01-05 (×2): qty 0.4

## 2017-01-05 MED ORDER — GI COCKTAIL ~~LOC~~
30.0000 mL | Freq: Four times a day (QID) | ORAL | Status: DC | PRN
Start: 2017-01-05 — End: 2017-01-07

## 2017-01-05 MED ORDER — ASPIRIN 325 MG PO TABS
325.0000 mg | ORAL_TABLET | Freq: Once | ORAL | Status: AC
  Administered 2017-01-05: 325 mg via ORAL
  Filled 2017-01-05: qty 1

## 2017-01-05 MED ORDER — ONDANSETRON HCL 4 MG/2ML IJ SOLN: 4.0000 mg | Freq: Four times a day (QID) | INTRAMUSCULAR | Status: DC | PRN

## 2017-01-05 NOTE — ED Provider Notes (Signed)
[facility]-EMERGENCY DEPT Provider Note   CSN: [id number] Arrival date & time: 01/05/17  1631     History   Chief Complaint Chief Complaint  Patient presents with  . Chest Pain    HPI Cecile SheererGregory Delosreyes is a 61 y.o. male. [place] of left anterior chest pain onset today 3 PMWhile at rest resolved spontaneously without treatment denies abdominal pain. Points to left anterior chest. He also reports shortness of breath yesterday has been having chest pain over the past several days sometimes with walking improved with rest. He reports feeling nauseated yesterday which resolved . He treated himself with aspirin the past several days. Last use aspirin 2 days ago. Presently asymptomatic except for slight swelling of his upper lip, right side which started 1 PM today. She reports she has not seen a doctor in several years. HPI  History reviewed. No pertinent past medical history. Past medical history negative There are no active problems to display for this patient.   History reviewed. No pertinent surgical history.     Home Medications    Prior to Admission medications   Medication Sig Start Date End Date Taking? Authorizing Provider  aspirin 325 MG tablet Take 325 mg by mouth every 6 (six) hours as needed for mild pain.    Historical Provider, MD  azithromycin (ZITHROMAX Z-PAK) 250 MG tablet 2 po day one, then 1 daily x 4 days Patient not taking: Reported on 09/08/2015 09/17/14   Joni ReiningNicole Pisciotta, PA-C  fluticasone (FLONASE) 50 MCG/ACT nasal spray Place 2 sprays into both nostrils daily. Patient not taking: Reported on 09/08/2015 09/17/14   Joni ReiningNicole Pisciotta, PA-C  omeprazole (PRILOSEC) 20 MG capsule Take 1 capsule (20 mg total) by mouth daily. 09/08/15   Tinnie GensJeffrey Hedges, PA-C  ondansetron (ZOFRAN) 4 MG tablet Take 1 tablet (4 mg total) by mouth every 6 (six) hours. 09/08/15   Eyvonne MechanicJeffrey Hedges, PA-C    Family History No family history on file.  Social History Social History  Substance Use Topics  .  Smoking status: Current Every Day Smoker    Packs/day: 0.50    Types: Cigarettes  . Smokeless tobacco: Never Used  . Alcohol use Yes  Ex smoker quit one year ago. Last used marijuana one year ago. No other drug use Allergies   Penicillins   Review of Systems Review of Systems  Constitutional: Negative.   HENT: Negative.        Lip swelling  Respiratory: Positive for shortness of breath.   Cardiovascular: Positive for chest pain and leg swelling.       Bilateral leg swelling for one month  Gastrointestinal: Positive for nausea.  Musculoskeletal: Negative.   Skin: Negative.   Neurological: Negative.   Psychiatric/Behavioral: Negative.      Physical Exam Updated Vital Signs BP (!) 168/93 (BP Location: Right Arm)   Pulse 79   Temp 98.1 F (36.7 C) (Oral)   Resp 18   Ht 5\' 9"  (1.753 m)   Wt 190 lb (86.2 kg)   SpO2 94%   BMI 28.06 kg/m   Physical Exam  Constitutional: He appears well-developed and well-nourished.  HENT:  Head: Normocephalic and atraumatic.  No facial asymmetry. No lip edema  Eyes: Conjunctivae are normal. Pupils are equal, round, and reactive to light.  Neck: Neck supple. No tracheal deviation present. No thyromegaly present.  Cardiovascular: Normal rate, regular rhythm, normal heart sounds and intact distal pulses.   No murmur heard. Pulmonary/Chest: Effort normal and breath sounds normal.  Abdominal: Soft. Bowel  sounds are normal. He exhibits no distension. There is no tenderness.  Musculoskeletal: Normal range of motion. He exhibits no tenderness.  1+ pretibial pitting edema bilaterally  Neurological: He is alert. Coordination normal.  Skin: Skin is warm and dry. No rash noted.  Psychiatric: He has a normal mood and affect.  Nursing note and vitals reviewed.    ED Treatments / Results  Labs (all labs ordered are listed, but only abnormal results are displayed) Labs Reviewed  CBC  BASIC METABOLIC PANEL  I-STAT TROPOININ, ED    EKG  EKG  Interpretation None     ED ECG REPORT   Date: 01/05/2017  Rate: 80  Rhythm: normal sinus rhythm  QRS Axis: right  Intervals: normal  ST/T Wave abnormalities: nonspecific T wave changes  Conduction Disutrbances:none  Narrative Interpretation:   Old EKG Reviewed: none available  I have personally reviewed the EKG tracing and agree with the computerized printout as noted. Chest x-ray viewed by me Radiology Dg Chest 2 View  Result Date: 01/05/2017 CLINICAL DATA:  Two days of shortness of breath with 1 day of left-sided chest pain. EXAM: CHEST  2 VIEW COMPARISON:  Chest x-ray of September 17, 2014 FINDINGS: The lungs arm adequately inflated. There are coarse lung markings in the infrahilar regions bilaterally but these appears stable. There is no alveolar infiltrate or pleural effusion. The heart and pulmonary vascularity are normal. The mediastinum is normal in width. There is chronic curvature of the thoracic spine convex toward the left. IMPRESSION: Mild chronic interstitial prominence likely reflects the patient's smoking history. There is no pneumonia, CHF, nor other acute cardiopulmonary abnormality. Electronically Signed   By: David  [place] M.D.   On: 01/05/2017 17:09    Procedures Procedures (including critical care time)  Medications Ordered in ED Medications  aspirin tablet 325 mg (not administered)   Results for orders placed or performed during the hospital encounter of 01/05/17  Basic metabolic panel  Result Value Ref Range   Sodium 141 135 - 145 mmol/L   Potassium 3.9 3.5 - 5.1 mmol/L   Chloride 111 101 - 111 mmol/L   CO2 25 22 - 32 mmol/L   Glucose, Bld 85 65 - 99 mg/dL   BUN 15 6 - 20 mg/dL   Creatinine, Ser 7.56 0.61 - 1.24 mg/dL   Calcium 8.2 (L) 8.9 - 10.3 mg/dL   GFR calc non Af Amer >60 >60 mL/min   GFR calc Af Amer >60 >60 mL/min   Anion gap 5 5 - 15  CBC  Result Value Ref Range   WBC 5.9 4.0 - 10.5 K/uL   RBC 4.97 4.22 - 5.81 MIL/uL   Hemoglobin 14.2  13.0 - 17.0 g/dL   HCT 43.3 29.5 - 18.8 %   MCV 83.5 78.0 - 100.0 fL   MCH 28.6 26.0 - 34.0 pg   MCHC 34.2 30.0 - 36.0 g/dL   RDW 41.6 60.6 - 30.1 %   Platelets 283 150 - 400 K/uL  I-stat troponin, ED  Result Value Ref Range   Troponin i, poc 0.00 0.00 - 0.08 ng/mL   Comment 3           Dg Chest 2 View  Result Date: 01/05/2017 CLINICAL DATA:  Two days of shortness of breath with 1 day of left-sided chest pain. EXAM: CHEST  2 VIEW COMPARISON:  Chest x-ray of September 17, 2014 FINDINGS: The lungs arm adequately inflated. There are coarse lung markings in the infrahilar regions bilaterally but  these appears stable. There is no alveolar infiltrate or pleural effusion. The heart and pulmonary vascularity are normal. The mediastinum is normal in width. There is chronic curvature of the thoracic spine convex toward the left. IMPRESSION: Mild chronic interstitial prominence likely reflects the patient's smoking history. There is no pneumonia, CHF, nor other acute cardiopulmonary abnormality. Electronically Signed   By: David  [place] M.D.   On: 01/05/2017 17:09    Initial Impression / Assessment and Plan / ED Course  I have reviewed the triage vital signs and the nursing notes.  Pertinent labs & imaging results that were available during my care of the patient were reviewed by me and considered in my medical decision making (see chart for details).     Hospitalist service consulted and will arrange for overnight stay  Final Clinical Impressions(s) / ED Diagnoses  Diagnosis chest pain arrest Final diagnoses:  None    New Prescriptions New Prescriptions   No medications on file     Doug Sou, MD 01/05/17 1824

## 2017-01-05 NOTE — ED Notes (Signed)
Pt transferred to floor with all his belongings

## 2017-01-05 NOTE — H&P (Signed)
History and Physical    Thomas Hinton ZOX:[id number] DOB: Feb 28, 1956 DOA: 01/05/2017  PCP: No PCP Per Patient - last saw a doctor >10 years ago Consultants:  None Patient coming from: home- lives alone; NOK: no one  Chief Complaint: abdominal/chest pain  HPI: Thomas Hinton is a 61 y.o. male with no known significant medical history and who has not seen a doctor in more than 10 years presenting with pain in abdomen and chest starting about 3pm today.  He was sitting down when the pain started.  Felt like he needed to belch badly, but no nausea.  No diaphoresis, but had some presyncope.  The pain appears to be in LUQ region.  Never had similar pain.   Denies chest discomfort with exertion.  Slight headache, tension with symptoms. Pain lasted about 2 hours and has resolved completely.   ED Course:  The history the ER doctor obtained was more concerning for anginal pain (heart score 4)  Review of Systems: As per HPI; otherwise  review of systems reviewed and negative.   Ambulatory Status:  Ambulates without assistance  History reviewed. No pertinent past medical history.  History reviewed. No pertinent surgical history.  Social History   Social History  . Marital status: Legally Separated    Spouse name: N/A  . Number of children: N/A  . Years of education: N/A   Occupational History  . Not on file.   Social History Main Topics  . Smoking status: Current Every Day Smoker    Packs/day: 0.50    Types: Cigarettes  . Smokeless tobacco: Never Used  . Alcohol use Yes  . Drug use: Yes    Types: Marijuana  . Sexual activity: Not on file   Other Topics Concern  . Not on file   Social History Narrative  . No narrative on file    Allergies  Allergen Reactions  . Penicillins Rash    Has patient had a PCN reaction causing immediate rash, facial/tongue/throat swelling, SOB or lightheadedness with hypotension: YES Has patient had a PCN reaction causing severe rash involving mucus  membranes or skin necrosis: NO Has patient had a PCN reaction that required hospitalizationNO Has patient had a PCN reaction occurring within the last 10 years: NO If all of the above answers are "NO", then may proceed with Cephalosporin use.    No family history on file.  Prior to Admission medications   Medication Sig Start Date End Date Taking? Authorizing Provider  azithromycin (ZITHROMAX Z-PAK) 250 MG tablet 2 po day one, then 1 daily x 4 days Patient not taking: Reported on 09/08/2015 09/17/14   Joni Reining Pisciotta, PA-C  fluticasone (FLONASE) 50 MCG/ACT nasal spray Place 2 sprays into both nostrils daily. Patient not taking: Reported on 09/08/2015 09/17/14   Joni Reining Pisciotta, PA-C  omeprazole (PRILOSEC) 20 MG capsule Take 1 capsule (20 mg total) by mouth daily. Patient not taking: Reported on 01/05/2017 09/08/15   Eyvonne Mechanic, PA-C  ondansetron (ZOFRAN) 4 MG tablet Take 1 tablet (4 mg total) by mouth every 6 (six) hours. Patient not taking: Reported on 01/05/2017 09/08/15   Eyvonne Mechanic, PA-C    Physical Exam: Vitals:   01/05/17 1636 01/05/17 1700 01/05/17 1715  BP: (!) 168/93 115/85 109/79  Pulse: 79 72 72  Resp: 18 15   Temp: 98.1 F (36.7 C)    TempSrc: Oral    SpO2: 94% 98% 96%  Weight: 86.2 kg (190 lb)    Height: 5\' 9"  (1.753 m)  General:  Appears calm and comfortable and is NAD Eyes:  PERRL, EOMI, normal lids, iris ENT:  grossly normal hearing, lips & tongue, mmm, poor dentition with a number of missing teeth Neck:  no LAD, masses or thyromegaly Cardiovascular:  RRR, no m/r/g. 1-2+ pitting mostly dependent LE edema.  Respiratory:  CTA bilaterally, no w/r/r. Normal respiratory effort. Abdomen:  soft, ntnd, NABS Skin:  no rash or induration seen on limited exam Musculoskeletal:  grossly normal tone BUE/BLE, good ROM, no bony abnormality Psychiatric:  grossly normal mood and affect, speech fluent and appropriate, AOx3 Neurologic:  CN 2-12 grossly intact, moves all  extremities in coordinated fashion, sensation intact  Labs on Admission: I have personally reviewed following labs and imaging studies  CBC:  Recent Labs Lab 01/05/17 1638  WBC 5.9  HGB 14.2  HCT 41.5  MCV 83.5  PLT 283   Basic Metabolic Panel:  Recent Labs Lab 01/05/17 1638  NA 141  K 3.9  CL 111  CO2 25  GLUCOSE 85  BUN 15  CREATININE 1.08  CALCIUM 8.2*   GFR: Estimated Creatinine Clearance: 80.1 mL/min (by C-G formula based on SCr of 1.08 mg/dL). Liver Function Tests: No results for input(s): AST, ALT, ALKPHOS, BILITOT, PROT, ALBUMIN in the last 168 hours. No results for input(s): LIPASE, AMYLASE in the last 168 hours. No results for input(s): AMMONIA in the last 168 hours. Coagulation Profile: No results for input(s): INR, PROTIME in the last 168 hours. Cardiac Enzymes: No results for input(s): CKTOTAL, CKMB, CKMBINDEX, TROPONINI in the last 168 hours. BNP (last 3 results) No results for input(s): PROBNP in the last 8760 hours. HbA1C: No results for input(s): HGBA1C in the last 72 hours. CBG: No results for input(s): GLUCAP in the last 168 hours. Lipid Profile: No results for input(s): CHOL, HDL, LDLCALC, TRIG, CHOLHDL, LDLDIRECT in the last 72 hours. Thyroid Function Tests: No results for input(s): TSH, T4TOTAL, FREET4, T3FREE, THYROIDAB in the last 72 hours. Anemia Panel: No results for input(s): VITAMINB12, FOLATE, FERRITIN, TIBC, IRON, RETICCTPCT in the last 72 hours. Urine analysis:    Component Value Date/Time   COLORURINE YELLOW 09/08/2015 1734   APPEARANCEUR CLEAR 09/08/2015 1734   LABSPEC 1.023 09/08/2015 1734   PHURINE 6.0 09/08/2015 1734   GLUCOSEU NEGATIVE 09/08/2015 1734   HGBUR SMALL (A) 09/08/2015 1734   BILIRUBINUR NEGATIVE 09/08/2015 1734   KETONESUR 40 (A) 09/08/2015 1734   PROTEINUR NEGATIVE 09/08/2015 1734   UROBILINOGEN 1.0 11/16/2007 2229   NITRITE NEGATIVE 09/08/2015 1734   LEUKOCYTESUR NEGATIVE 09/08/2015 1734     Creatinine Clearance: Estimated Creatinine Clearance: 80.1 mL/min (by C-G formula based on SCr of 1.08 mg/dL).  Sepsis Labs: @LABRCNTIP (procalcitonin:4,lacticidven:4) )No results found for this or any previous visit (from the past 240 hour(s)).   Radiological Exams on Admission: Dg Chest 2 View  Result Date: 01/05/2017 CLINICAL DATA:  Two days of shortness of breath with 1 day of left-sided chest pain. EXAM: CHEST  2 VIEW COMPARISON:  Chest x-ray of September 17, 2014 FINDINGS: The lungs arm adequately inflated. There are coarse lung markings in the infrahilar regions bilaterally but these appears stable. There is no alveolar infiltrate or pleural effusion. The heart and pulmonary vascularity are normal. The mediastinum is normal in width. There is chronic curvature of the thoracic spine convex toward the left. IMPRESSION: Mild chronic interstitial prominence likely reflects the patient's smoking history. There is no pneumonia, CHF, nor other acute cardiopulmonary abnormality. Electronically Signed   By: Onalee Hua  [place] M.D.   On: 01/05/2017 17:09    EKG: Not currently available for review in Epic.  Per Dr. Ethelda ChickJacubowitz: NRS, rate 80, nonspecific ST changes  Assessment/Plan Active Problems:   Chest pain   -Patient with LUQ abdominal/left-sided chest pain began at rest and lasted about 2 hours, resolving spontaneously. -Symptoms suggestive of noncardiac chest pain.  -CXR unremarkable.   -Initial cardiac troponin negative.  -EKG not indicative of acute ischemia according to ER doctor (it is not currently available in Epic). -GRACE score is 100; which predicts an in-hospital death rate of 0.8%.  -Will plan to place in observation status on telemetry to rule out ACS by overnight observation.  -cycle troponin q6h x 3 and repeat EKG in AM -Start ASA daily -morphine given -Risk factor stratification with FLP; will also check TSH and UDS -Will check lipase due to possible LUQ pain. -With  relatively low suspicion for CAD, will not consult cardiology at this time.  If new risk factors/concerns emerge, then cardiology should be consulted in the AM or sooner if needed.  Elevated BP -Monitor for HTN -prn hydralazine overnight for SBP >180   DVT prophylaxis: Lovenox Code Status: Full - confirmed with patient Family Communication: None present Disposition Plan:  Home once clinically improved Consults called: None  Admission status: It is my clinical opinion that referral for OBSERVATION is reasonable and necessary in this patient based on the above information provided. The aforementioned taken together are felt to place the patient at high risk for further clinical deterioration. However it is anticipated that the patient may be medically stable for discharge from the hospital within 24 to 48 hours.    Jonah BlueJennifer Suliman Termini MD [facility]  If 7PM-7AM, please contact night-coverage [email or web address] Password [email or web address]  01/05/2017, 6:50 PM

## 2017-01-05 NOTE — ED Triage Notes (Signed)
Per Pt, Pt reports som SOB yesterday and then today at work he had mid-center chest pain that radiates to his abdomen. Pt denies any N/V/D or dizziness. Right Lip swelling noted with no known source.

## 2017-01-06 ENCOUNTER — Observation Stay (HOSPITAL_COMMUNITY): Payer: Self-pay

## 2017-01-06 DIAGNOSIS — R079 Chest pain, unspecified: Secondary | ICD-10-CM

## 2017-01-06 LAB — RAPID URINE DRUG SCREEN, HOSP PERFORMED
AMPHETAMINES: NOT DETECTED
Barbiturates: NOT DETECTED
Benzodiazepines: NOT DETECTED
COCAINE: NOT DETECTED
OPIATES: NOT DETECTED
Tetrahydrocannabinol: NOT DETECTED

## 2017-01-06 LAB — TROPONIN I

## 2017-01-06 LAB — LIPID PANEL
Cholesterol: 152 mg/dL (ref 0–200)
HDL: 37 mg/dL — ABNORMAL LOW (ref >40)
LDL Cholesterol: 105 mg/dL — ABNORMAL HIGH (ref 0–99)
Total CHOL/HDL Ratio: 4.1 RATIO
Triglycerides: 51 mg/dL (ref <150)
VLDL: 10 mg/dL (ref 0–40)

## 2017-01-06 LAB — NM MYOCAR MULTI W/SPECT W/WALL MOTION / EF
CHL CUP RESTING HR STRESS: 62 {beats}/min
CSEPED: 5 min
CSEPHR: 56 %
CSEPPHR: 91 {beats}/min
MPHR: 161 {beats}/min

## 2017-01-06 LAB — HIV ANTIBODY (ROUTINE TESTING W REFLEX): HIV SCREEN 4TH GENERATION: NONREACTIVE

## 2017-01-06 MED ORDER — TECHNETIUM TC 99M TETROFOSMIN IV KIT
30.0000 | PACK | Freq: Once | INTRAVENOUS | Status: AC | PRN
  Administered 2017-01-06: 30 via INTRAVENOUS

## 2017-01-06 MED ORDER — REGADENOSON 0.4 MG/5ML IV SOLN
0.4000 mg | Freq: Once | INTRAVENOUS | Status: DC
  Filled 2017-01-06: qty 5

## 2017-01-06 MED ORDER — TECHNETIUM TC 99M TETROFOSMIN IV KIT
10.0000 | PACK | Freq: Once | INTRAVENOUS | Status: AC | PRN
  Administered 2017-01-06: 10 via INTRAVENOUS

## 2017-01-06 MED ORDER — REGADENOSON 0.4 MG/5ML IV SOLN
INTRAVENOUS | Status: AC
  Filled 2017-01-06: qty 5

## 2017-01-06 MED ORDER — PNEUMOCOCCAL VAC POLYVALENT 25 MCG/0.5ML IJ INJ: 0.5000 mL | INJECTION | INTRAMUSCULAR | Status: DC

## 2017-01-06 NOTE — Progress Notes (Signed)
   Thomas SheererGregory Elsberry presented for a nuclear stress test today.  No immediate complications.  Stress imaging is pending at this time.  Preliminary EKG findings may be listed in the chart, but the stress test result will not be finalized until perfusion imaging is complete.  [name], PA-C 01/06/2017, 3:41 PM

## 2017-01-06 NOTE — Progress Notes (Signed)
PROGRESS NOTE    Thomas SheererGregory Quilling  ZOX:[id number] DOB: Jun 02, 1956 DOA: 01/05/2017 PCP: No PCP Per Patient    Brief Narrative: Thomas Hinton is a 61 y.o. male with no known significant medical history, comes in for left sided chest pain.   Assessment & Plan:   Principal Problem:   Chest pain Active Problems:   Elevated BP without diagnosis of hypertension   Atypical chest pain:  ACS ruled out,b ut myoview was abnormal with   Scarring in the inferior wall.  No inducible ischemia identified.  2. Considerable hypokinesis and poor wall thickening involving the lateral wall, inferior wall, and portions of the apex and anterior wall.  3. Left ventricular ejection fraction 38%.  Will get echo and cardiology consultation in am.   Hypertension: well controlled.    DVT prophylaxis: (Lovenox/ Code Status: full code.  Family Communication: none at bedside.  Disposition Plan: pending cardiology recommendations.    Consultants:   Cardiology in am.    Procedures: stress test.    Antimicrobials: none.    Subjective: No chst pain at this time.   Objective: Vitals:   01/06/17 1500 01/06/17 1537 01/06/17 1538 01/06/17 1540  BP: 105/67 97/68 95/69  99/67  Pulse:      Resp:      Temp:      TempSrc:      SpO2:      Weight:      Height:        Intake/Output Summary (Last 24 hours) at 01/06/17 1814 Last data filed at 01/06/17 0500  Gross per 24 hour  Intake              420 ml  Output             1200 ml  Net             -780 ml   Filed Weights   01/05/17 1636 01/05/17 2000  Weight: 86.2 kg (190 lb) 99.8 kg (220 lb)    Examination:  General exam: Appears calm and comfortable  Respiratory system: Clear to auscultation. Respiratory effort normal. Cardiovascular system: S1 & S2 heard, RRR. No JVD, murmurs, rubs, gallops or clicks. No pedal edema. Gastrointestinal system: Abdomen is nondistended, soft and nontender. No organomegaly or masses felt. Normal bowel  sounds heard. Central nervous system: Alert and oriented. No focal neurological deficits. Extremities: Symmetric 5 x 5 power. Skin: No rashes, lesions or ulcers Psychiatry: Judgement and insight appear normal. Mood & affect appropriate.     Data Reviewed: I have personally reviewed following labs and imaging studies  CBC:  Recent Labs Lab 01/05/17 1638  WBC 5.9  HGB 14.2  HCT 41.5  MCV 83.5  PLT 283   Basic Metabolic Panel:  Recent Labs Lab 01/05/17 1638  NA 141  K 3.9  CL 111  CO2 25  GLUCOSE 85  BUN 15  CREATININE 1.08  CALCIUM 8.2*   GFR: Estimated Creatinine Clearance: 85.7 mL/min (by C-G formula based on SCr of 1.08 mg/dL). Liver Function Tests: No results for input(s): AST, ALT, ALKPHOS, BILITOT, PROT, ALBUMIN in the last 168 hours.  Recent Labs Lab 01/05/17 1921  LIPASE 38   No results for input(s): AMMONIA in the last 168 hours. Coagulation Profile: No results for input(s): INR, PROTIME in the last 168 hours. Cardiac Enzymes:  Recent Labs Lab 01/05/17 2033 01/06/17 0221 01/06/17 0921  TROPONINI <0.03 <0.03 <0.03   BNP (last 3 results) No results for input(s): PROBNP in the last  8760 hours. HbA1C: No results for input(s): HGBA1C in the last 72 hours. CBG: No results for input(s): GLUCAP in the last 168 hours. Lipid Profile:  Recent Labs  01/06/17 0221  CHOL 152  HDL 37*  LDLCALC 105*  TRIG 51  CHOLHDL 4.1   Thyroid Function Tests:  Recent Labs  01/05/17 1929  TSH 2.642   Anemia Panel: No results for input(s): VITAMINB12, FOLATE, FERRITIN, TIBC, IRON, RETICCTPCT in the last 72 hours. Sepsis Labs: No results for input(s): PROCALCITON, LATICACIDVEN in the last 168 hours.  No results found for this or any previous visit (from the past 240 hour(s)).       Radiology Studies: Dg Chest 2 View  Result Date: 01/05/2017 CLINICAL DATA:  Two days of shortness of breath with 1 day of left-sided chest pain. EXAM: CHEST  2 VIEW  COMPARISON:  Chest x-ray of September 17, 2014 FINDINGS: The lungs arm adequately inflated. There are coarse lung markings in the infrahilar regions bilaterally but these appears stable. There is no alveolar infiltrate or pleural effusion. The heart and pulmonary vascularity are normal. The mediastinum is normal in width. There is chronic curvature of the thoracic spine convex toward the left. IMPRESSION: Mild chronic interstitial prominence likely reflects the patient's smoking history. There is no pneumonia, CHF, nor other acute cardiopulmonary abnormality. Electronically Signed   By: David  [place] M.D.   On: 01/05/2017 17:09   Nm Myocar Multi W/spect W/wall Motion / Ef  Result Date: 01/06/2017 CLINICAL DATA:  Chest pain. Family history of heart disease. Shortness of breath. EXAM: MYOCARDIAL IMAGING WITH SPECT (REST AND PHARMACOLOGIC-STRESS) GATED LEFT VENTRICULAR WALL MOTION STUDY LEFT VENTRICULAR EJECTION FRACTION TECHNIQUE: Standard myocardial SPECT imaging was performed after resting intravenous injection of 10 mCi Tc-12m tetrofosmin. Subsequently, intravenous infusion of Lexiscan was performed under the supervision of the Cardiology staff. At peak effect of the drug, 30 mCi Tc-69m tetrofosmin was injected intravenously and standard myocardial SPECT imaging was performed. Quantitative gated imaging was also performed to evaluate left ventricular wall motion, and estimate left ventricular ejection fraction. COMPARISON:  None. FINDINGS: Perfusion: Reduced activity in the inferior wall on stress and rest images compatible with scar. No inducible ischemia identified. Wall Motion: Poor wall thickening and poor wall motion especially in the lateral wall and inferior wall, and also involving the apex and portions of the anterior wall. Left Ventricular Ejection Fraction: A 38 % End diastolic volume 103 ml End systolic volume 64 ml IMPRESSION: 1. Scarring in the inferior wall.  No inducible ischemia identified. 2.  Considerable hypokinesis and poor wall thickening involving the lateral wall, inferior wall, and portions of the apex and anterior wall. 3. Left ventricular ejection fraction 38% 4. Non invasive risk stratification*: Intermediate *2012 Appropriate Use Criteria for Coronary Revascularization Focused Update: J Am Coll Cardiol. 2012;59(9):857-881. http://[email or web address] Electronically Signed   By: Gaylyn Rong M.D.   On: 01/06/2017 17:03        Scheduled Meds: . regadenoson      . aspirin EC  325 mg Oral Daily  . enoxaparin (LOVENOX) injection  40 mg Subcutaneous Q24H  . [START ON 01/07/2017] pneumococcal 23 valent vaccine  0.5 mL Intramuscular Tomorrow-1000  . regadenoson  0.4 mg Intravenous Once   Continuous Infusions:   LOS: 0 days    Time spent: 35 minutes.     Kathlen Mody, MD [facility] Pager [phone]  If 7PM-7AM, please contact night-coverage [email or web address] Password [email or web address] 01/06/2017, 6:14 PM

## 2017-01-07 ENCOUNTER — Other Ambulatory Visit (HOSPITAL_COMMUNITY): Payer: Self-pay

## 2017-01-07 ENCOUNTER — Inpatient Hospital Stay (HOSPITAL_COMMUNITY): Payer: Self-pay

## 2017-01-07 DIAGNOSIS — R9439 Abnormal result of other cardiovascular function study: Secondary | ICD-10-CM

## 2017-01-07 DIAGNOSIS — R072 Precordial pain: Secondary | ICD-10-CM

## 2017-01-07 LAB — ECHOCARDIOGRAM COMPLETE
Height: 69 in
WEIGHTICAEL: 3520 [oz_av]

## 2017-01-07 NOTE — Progress Notes (Signed)
  Echocardiogram 2D Echocardiogram has been performed.  Arvil ChacoFoster, Dajia Gunnels 01/07/2017, 4:07 PM

## 2017-01-07 NOTE — Consult Note (Signed)
CONSULTATION NOTE   Patient Name: Thomas Hinton Date of Encounter: 01/07/2017 Cardiologist: None (NEW)  Hospital Problem List   Principal Problem:   Chest pain Active Problems:   Elevated BP without diagnosis of hypertension    HPI   Thomas Hinton is a 61 y.o. male who is being seen today for the evaluation of chest pain at the request of Dr. Karleen Hampshire. [name] Presented to [facility] on 01/05/2017 with upper abdominal and lower chest pain that started around 3 PM on that day. He described pain in the left upper quadrant that was not associated with exertion or relieved by rest. It may have improved with belching. He was also having some headache. He said the pain lasted for several hours and resolved completely. There is family history of heart disease in his mother at age 57. He does have a 15 pack year smoking history and uses alcohol. He has not seen a doctor in over 10 years therefore has not had any risk factor management. Troponins were initially negative 3. He underwent a Lexiscan Myoview yesterday which demonstrated no inducible ischemia however a fixed defect of the inferior wall including "considerable hypokinesis and poor wall thickening involving the lateral wall, inferior wall and portions of the apex and anterior wall". LVEF was calculated at 38%. I personally reviewed the nuclear stress images and there is high intensity signal below the diaphragm which is likely causing diaphragmatic attenuation artifact, otherwise there is no evidence of ischemia. I suspect that the LVEF is artifactually low. An echocardiogram is pending. Currently he denies any recurrent chest pain or abdominal pain. He reports longstanding LE edema, but no DOE, orthopnea or PND.  PMHx   History reviewed. No pertinent past medical history.  Past Surgical History:  Procedure Laterality Date  . growth on ear Left     FAMHx   Family History  Problem Relation Age of Onset  . CAD Mother 77     SOCHx    reports that he has quit smoking. His smoking use included Cigarettes. He has a 15.00 pack-year smoking history. He has never used smokeless tobacco. He reports that he drinks alcohol. He reports that he does not use drugs.  Outpatient Medications   No current facility-administered medications on file prior to encounter.    No current outpatient prescriptions on file prior to encounter.    Inpatient Medications    Scheduled Meds: . aspirin EC  325 mg Oral Daily  . enoxaparin (LOVENOX) injection  40 mg Subcutaneous Q24H  . pneumococcal 23 valent vaccine  0.5 mL Intramuscular Tomorrow-1000  . regadenoson  0.4 mg Intravenous Once    Continuous Infusions: None  PRN Meds: acetaminophen, gi cocktail, hydrALAZINE, morphine injection, ondansetron (ZOFRAN) IV   ALLERGIES   Allergies  Allergen Reactions  . Penicillins Rash    Has patient had a PCN reaction causing immediate rash, facial/tongue/throat swelling, SOB or lightheadedness with hypotension: YES Has patient had a PCN reaction causing severe rash involving mucus membranes or skin necrosis: NO Has patient had a PCN reaction that required hospitalizationNO Has patient had a PCN reaction occurring within the last 10 years: NO If all of the above answers are "NO", then may proceed with Cephalosporin use.    ROS   Pertinent items noted in HPI and remainder of comprehensive ROS otherwise negative.  Vitals   Vitals:   01/06/17 1538 01/06/17 1540 01/06/17 1953 01/07/17 0519  BP: 95/69 99/67 115/66 (!) 107/58  Pulse:  61 61  Resp:   18 18  Temp:   97.8 F (36.6 C) 98 F (36.7 C)  TempSrc:   Oral Oral  SpO2:   98% 97%  Weight:      Height:        Intake/Output Summary (Last 24 hours) at 01/07/17 0947 Last data filed at 01/06/17 2200  Gross per 24 hour  Intake              240 ml  Output              600 ml  Net             -360 ml   Filed Weights   01/05/17 1636 01/05/17 2000  Weight: 190 lb  (86.2 kg) 220 lb (99.8 kg)    Physical Exam   General appearance: alert and no distress Neck: no carotid bruit, no JVD and thyroid not enlarged, symmetric, no tenderness/mass/nodules Lungs: clear to auscultation bilaterally Heart: regular rate and rhythm Abdomen: soft, non-tender; bowel sounds normal; no masses,  no organomegaly Extremities: edema 1+ bilateral LE edema Pulses: 2+ and symmetric Skin: Skin color, texture, turgor normal. No rashes or lesions Neurologic: Grossly normal Psych: Pleasant  Labs   Results for orders placed or performed during the hospital encounter of 01/05/17 (from the past 48 hour(s))  Basic metabolic panel     Status: Abnormal   Collection Time: 01/05/17  4:38 PM  Result Value Ref Range   Sodium 141 135 - 145 mmol/L   Potassium 3.9 3.5 - 5.1 mmol/L   Chloride 111 101 - 111 mmol/L   CO2 25 22 - 32 mmol/L   Glucose, Bld 85 65 - 99 mg/dL   BUN 15 6 - 20 mg/dL   Creatinine, Ser 1.08 0.61 - 1.24 mg/dL   Calcium 8.2 (L) 8.9 - 10.3 mg/dL   GFR calc non Af Amer >60 >60 mL/min   GFR calc Af Amer >60 >60 mL/min    Comment: (NOTE) The eGFR has been calculated using the CKD EPI equation. This calculation has not been validated in all clinical situations. eGFR's persistently <60 mL/min signify possible Chronic Kidney Disease.    Anion gap 5 5 - 15  CBC     Status: None   Collection Time: 01/05/17  4:38 PM  Result Value Ref Range   WBC 5.9 4.0 - 10.5 K/uL   RBC 4.97 4.22 - 5.81 MIL/uL   Hemoglobin 14.2 13.0 - 17.0 g/dL   HCT 41.5 39.0 - 52.0 %   MCV 83.5 78.0 - 100.0 fL   MCH 28.6 26.0 - 34.0 pg   MCHC 34.2 30.0 - 36.0 g/dL   RDW 14.2 11.5 - 15.5 %   Platelets 283 150 - 400 K/uL  I-stat troponin, ED     Status: None   Collection Time: 01/05/17  4:57 PM  Result Value Ref Range   Troponin i, poc 0.00 0.00 - 0.08 ng/mL   Comment 3            Comment: Due to the release kinetics of cTnI, a negative result within the first hours of the onset of  symptoms does not rule out myocardial infarction with certainty. If myocardial infarction is still suspected, repeat the test at appropriate intervals.   Lipase, blood     Status: None   Collection Time: 01/05/17  7:21 PM  Result Value Ref Range   Lipase 38 11 - 51 U/L  TSH  Status: None   Collection Time: 01/05/17  7:29 PM  Result Value Ref Range   TSH 2.642 0.350 - 4.500 uIU/mL    Comment: Performed by a 3rd Generation assay with a functional sensitivity of <=0.01 uIU/mL.  Troponin I-serum (0, 3, 6 hours)     Status: None   Collection Time: 01/05/17  8:33 PM  Result Value Ref Range   Troponin I <0.03 <0.03 ng/mL  Urine rapid drug screen (hosp performed)     Status: None   Collection Time: 01/05/17  9:29 PM  Result Value Ref Range   Opiates NONE DETECTED NONE DETECTED   Cocaine NONE DETECTED NONE DETECTED   Benzodiazepines NONE DETECTED NONE DETECTED   Amphetamines NONE DETECTED NONE DETECTED   Tetrahydrocannabinol NONE DETECTED NONE DETECTED   Barbiturates NONE DETECTED NONE DETECTED    Comment:        DRUG SCREEN FOR MEDICAL PURPOSES ONLY.  IF CONFIRMATION IS NEEDED FOR ANY PURPOSE, NOTIFY LAB WITHIN 5 DAYS.        LOWEST DETECTABLE LIMITS FOR URINE DRUG SCREEN Drug Class       Cutoff (ng/mL) Amphetamine      1000 Barbiturate      200 Benzodiazepine   098 Tricyclics       119 Opiates          300 Cocaine          300 THC              50   Troponin I-serum (0, 3, 6 hours)     Status: None   Collection Time: 01/06/17  2:21 AM  Result Value Ref Range   Troponin I <0.03 <0.03 ng/mL  HIV antibody     Status: None   Collection Time: 01/06/17  2:21 AM  Result Value Ref Range   HIV Screen 4th Generation wRfx Non Reactive Non Reactive    Comment: (NOTE) Performed At: [facility] [place], [place] [id number] Lindon Romp MD ZH:[id number]   Lipid panel     Status: Abnormal   Collection Time: 01/06/17  2:21 AM  Result Value Ref Range    Cholesterol 152 0 - 200 mg/dL   Triglycerides 51 <150 mg/dL   HDL 37 (L) >40 mg/dL   Total CHOL/HDL Ratio 4.1 RATIO   VLDL 10 0 - 40 mg/dL   LDL Cholesterol 105 (H) 0 - 99 mg/dL    Comment:        Total Cholesterol/HDL:CHD Risk Coronary Heart Disease Risk Table                     Men   Women  1/2 Average Risk   3.4   3.3  Average Risk       5.0   4.4  2 X Average Risk   9.6   7.1  3 X Average Risk  23.4   11.0        Use the calculated Patient Ratio above and the CHD Risk Table to determine the patient's CHD Risk.        ATP III CLASSIFICATION (LDL):  <100     mg/dL   Optimal  100-129  mg/dL   Near or Above                    Optimal  130-159  mg/dL   Borderline  160-189  mg/dL   High  >190     mg/dL  Very High   Troponin I-serum (0, 3, 6 hours)     Status: None   Collection Time: 01/06/17  9:21 AM  Result Value Ref Range   Troponin I <0.03 <0.03 ng/mL    ECG   Sinus rhythm at 79, inferior Q waves (<1 box wide) - Personally Reviewed  Telemetry   Sinus rhythm - Personally Reviewed  Radiology   Dg Chest 2 View  Result Date: 01/05/2017 CLINICAL DATA:  Two days of shortness of breath with 1 day of left-sided chest pain. EXAM: CHEST  2 VIEW COMPARISON:  Chest x-ray of September 17, 2014 FINDINGS: The lungs arm adequately inflated. There are coarse lung markings in the infrahilar regions bilaterally but these appears stable. There is no alveolar infiltrate or pleural effusion. The heart and pulmonary vascularity are normal. The mediastinum is normal in width. There is chronic curvature of the thoracic spine convex toward the left. IMPRESSION: Mild chronic interstitial prominence likely reflects the patient's smoking history. There is no pneumonia, CHF, nor other acute cardiopulmonary abnormality. Electronically Signed   By: David  Martinique M.D.   On: 01/05/2017 17:09   Nm Myocar Multi W/spect W/wall Motion / Ef  Result Date: 01/06/2017 CLINICAL DATA:  Chest pain. Family history  of heart disease. Shortness of breath. EXAM: MYOCARDIAL IMAGING WITH SPECT (REST AND PHARMACOLOGIC-STRESS) GATED LEFT VENTRICULAR WALL MOTION STUDY LEFT VENTRICULAR EJECTION FRACTION TECHNIQUE: Standard myocardial SPECT imaging was performed after resting intravenous injection of 10 mCi Tc-38mtetrofosmin. Subsequently, intravenous infusion of Lexiscan was performed under the supervision of the Cardiology staff. At peak effect of the drug, 30 mCi Tc-928metrofosmin was injected intravenously and standard myocardial SPECT imaging was performed. Quantitative gated imaging was also performed to evaluate left ventricular wall motion, and estimate left ventricular ejection fraction. COMPARISON:  None. FINDINGS: Perfusion: Reduced activity in the inferior wall on stress and rest images compatible with scar. No inducible ischemia identified. Wall Motion: Poor wall thickening and poor wall motion especially in the lateral wall and inferior wall, and also involving the apex and portions of the anterior wall. Left Ventricular Ejection Fraction: A 38 % End diastolic volume 10287l End systolic volume 64 ml IMPRESSION: 1. Scarring in the inferior wall.  No inducible ischemia identified. 2. Considerable hypokinesis and poor wall thickening involving the lateral wall, inferior wall, and portions of the apex and anterior wall. 3. Left ventricular ejection fraction 38% 4. Non invasive risk stratification*: Intermediate *2012 Appropriate Use Criteria for Coronary Revascularization Focused Update: J Am Coll Cardiol. 208676;72(0):[email or web address]?[email or web address] Electronically Signed   By: WaVan Clines.D.   On: 01/06/2017 17:03    Cardiac Studies   (see NST above)  Impression   1. Principal Problem: 2.   Chest pain 3. Active Problems: 4.   Elevated BP without diagnosis of hypertension 5.   Recommendation   1. Mr. HaBrodheadad an abnormal myoview, but had atypical chest pain and  ruled out for acute coronary syndrome. I personally reviewed his Myoview stress test and believe that most likely there is inferior bowel attenuation artifact due to high signal below the diaphragm. EF was read as 38% with a fixed defect suggestive of scar however I suspect this is artifact. He has Q waves inferiorly on his EKG, however, I don't think they meet diagnostic criteria for prior MI. I agree with a repeat echocardiogram to look at LV function. The only physical findings of significance are lower extremity edema which is been present for some time. If  there is decreased LV function or thinning suggestive of scar in his echocardiogram that he will likely need heart catheterization. Cholesterol is fairly well-controlled. I counseled him on healthy diet and increasing exercise and activity to his lifestyle.  Thanks for the consultation. I will follow up on his echo later today.  Time Spent Directly with Patient:  35 minutes  Length of Stay:  LOS: 1 day   Pixie Casino, MD, [place]  Attending Cardiologist  Direct Dial: [phone]  Fax: [phone]  Website: [email or web address] 01/07/2017, 9:47 AM

## 2017-01-08 NOTE — Discharge Summary (Signed)
Physician Discharge Summary  Thomas Hinton ZOX:[id number] DOB: Jan 09, 1956 DOA: 01/05/2017  PCP: Patient, No Pcp Per  Admit date: 01/05/2017 Discharge date: 01/07/2017  Admitted From: Home Disposition:  Home.   Recommendations for Outpatient Follow-up:  1. Follow up with PCP in 1-2 weeks 2. Please obtain BMP/CBC in one week    Discharge Condition:stable.  CODE STATUS:full code.  Diet recommendation: Heart Healthy    Brief/Interim Summary:  Thomas Hinton a 61 y.o.malewith no known significant medical history, comes in for left sided chest pain.   Discharge Diagnoses:  Principal Problem:   Chest pain Active Problems:   Elevated BP without diagnosis of hypertension   Atypical chest pain:  ACS ruled out,b ut myoview was abnormal with   Scarring in the inferior wall. No inducible ischemia identified.  2. Considerable hypokinesis and poor wall thickening involving the lateral wall, inferior wall, and portions of the apex and anterior wall.  3. Left ventricular ejection fraction 38%. Echocardiogram shows EF of 55%, no wall motion abnormalities and cardiology consulted, recommended the scarring seen probably an artifact.  Discharged home with follow up with PCP as needed.   Hypertension: well controlled.    Discharge Instructions  Discharge Instructions    Diet - low sodium heart healthy    Complete by:  As directed    Discharge instructions    Complete by:  As directed    Please follow up with PCP in 1 to 2 weeks.     Allergies as of 01/07/2017      Reactions   Penicillins Rash   Has patient had a PCN reaction causing immediate rash, facial/tongue/throat swelling, SOB or lightheadedness with hypotension: YES Has patient had a PCN reaction causing severe rash involving mucus membranes or skin necrosis: NO Has patient had a PCN reaction that required hospitalizationNO Has patient had a PCN reaction occurring within the last 10 years: NO If all of the above  answers are "NO", then may proceed with Cephalosporin use.      Medication List    You have not been prescribed any medications.     Allergies  Allergen Reactions  . Penicillins Rash    Has patient had a PCN reaction causing immediate rash, facial/tongue/throat swelling, SOB or lightheadedness with hypotension: YES Has patient had a PCN reaction causing severe rash involving mucus membranes or skin necrosis: NO Has patient had a PCN reaction that required hospitalizationNO Has patient had a PCN reaction occurring within the last 10 years: NO If all of the above answers are "NO", then may proceed with Cephalosporin use.    Consultations:  Cardiology.    Procedures/Studies: Dg Chest 2 View  Result Date: 01/05/2017 CLINICAL DATA:  Two days of shortness of breath with 1 day of left-sided chest pain. EXAM: CHEST  2 VIEW COMPARISON:  Chest x-ray of September 17, 2014 FINDINGS: The lungs arm adequately inflated. There are coarse lung markings in the infrahilar regions bilaterally but these appears stable. There is no alveolar infiltrate or pleural effusion. The heart and pulmonary vascularity are normal. The mediastinum is normal in width. There is chronic curvature of the thoracic spine convex toward the left. IMPRESSION: Mild chronic interstitial prominence likely reflects the patient's smoking history. There is no pneumonia, CHF, nor other acute cardiopulmonary abnormality. Electronically Signed   By: David  [place] M.D.   On: 01/05/2017 17:09   Nm Myocar Multi W/spect W/wall Motion / Ef  Result Date: 01/06/2017 CLINICAL DATA:  Chest pain. Family history of heart disease.  Shortness of breath. EXAM: MYOCARDIAL IMAGING WITH SPECT (REST AND PHARMACOLOGIC-STRESS) GATED LEFT VENTRICULAR WALL MOTION STUDY LEFT VENTRICULAR EJECTION FRACTION TECHNIQUE: Standard myocardial SPECT imaging was performed after resting intravenous injection of 10 mCi Tc-31m tetrofosmin. Subsequently, intravenous infusion of  Lexiscan was performed under the supervision of the Cardiology staff. At peak effect of the drug, 30 mCi Tc-67m tetrofosmin was injected intravenously and standard myocardial SPECT imaging was performed. Quantitative gated imaging was also performed to evaluate left ventricular wall motion, and estimate left ventricular ejection fraction. COMPARISON:  None. FINDINGS: Perfusion: Reduced activity in the inferior wall on stress and rest images compatible with scar. No inducible ischemia identified. Wall Motion: Poor wall thickening and poor wall motion especially in the lateral wall and inferior wall, and also involving the apex and portions of the anterior wall. Left Ventricular Ejection Fraction: A 38 % End diastolic volume 103 ml End systolic volume 64 ml IMPRESSION: 1. Scarring in the inferior wall.  No inducible ischemia identified. 2. Considerable hypokinesis and poor wall thickening involving the lateral wall, inferior wall, and portions of the apex and anterior wall. 3. Left ventricular ejection fraction 38% 4. Non invasive risk stratification*: Intermediate *2012 Appropriate Use Criteria for Coronary Revascularization Focused Update: J Am Coll Cardiol. 2012;59(9):857-881. http://[email or web address]?[email or web address] Electronically Signed   By: Gaylyn Rong M.D.   On: 01/06/2017 17:03    Echocardiogram.    Subjective: No complaints, wants to go home.   Discharge Exam: Vitals:   01/07/17 0519 01/07/17 1318  BP: (!) 107/58 103/66  Pulse: 61 72  Resp: 18 18  Temp: 98 F (36.7 C) 97.9 F (36.6 C)   Vitals:   01/06/17 1540 01/06/17 1953 01/07/17 0519 01/07/17 1318  BP: 99/67 115/66 (!) 107/58 103/66  Pulse:  61 61 72  Resp:  18 18 18   Temp:  97.8 F (36.6 C) 98 F (36.7 C) 97.9 F (36.6 C)  TempSrc:  Oral Oral Oral  SpO2:  98% 97% 97%  Weight:      Height:        General: Pt is alert, awake, not in acute distress Cardiovascular: RRR, S1/S2 +, no rubs, no  gallops Respiratory: CTA bilaterally, no wheezing, no rhonchi Abdominal: Soft, NT, ND, bowel sounds + Extremities: no edema, no cyanosis    The results of significant diagnostics from this hospitalization (including imaging, microbiology, ancillary and laboratory) are listed below for reference.     Microbiology: No results found for this or any previous visit (from the past 240 hour(s)).   Labs: BNP (last 3 results) No results for input(s): BNP in the last 8760 hours. Basic Metabolic Panel:  Recent Labs Lab 01/05/17 1638  NA 141  K 3.9  CL 111  CO2 25  GLUCOSE 85  BUN 15  CREATININE 1.08  CALCIUM 8.2*   Liver Function Tests: No results for input(s): AST, ALT, ALKPHOS, BILITOT, PROT, ALBUMIN in the last 168 hours.  Recent Labs Lab 01/05/17 1921  LIPASE 38   No results for input(s): AMMONIA in the last 168 hours. CBC:  Recent Labs Lab 01/05/17 1638  WBC 5.9  HGB 14.2  HCT 41.5  MCV 83.5  PLT 283   Cardiac Enzymes:  Recent Labs Lab 01/05/17 2033 01/06/17 0221 01/06/17 0921  TROPONINI <0.03 <0.03 <0.03   BNP: Invalid input(s): POCBNP CBG: No results for input(s): GLUCAP in the last 168 hours. D-Dimer No results for input(s): DDIMER in the last 72 hours. Hgb A1c No results for input(s): HGBA1C in  the last 72 hours. Lipid Profile  Recent Labs  01/06/17 0221  CHOL 152  HDL 37*  LDLCALC 105*  TRIG 51  CHOLHDL 4.1   Thyroid function studies  Recent Labs  01/05/17 1929  TSH 2.642   Anemia work up No results for input(s): VITAMINB12, FOLATE, FERRITIN, TIBC, IRON, RETICCTPCT in the last 72 hours. Urinalysis    Component Value Date/Time   COLORURINE YELLOW 09/08/2015 1734   APPEARANCEUR CLEAR 09/08/2015 1734   LABSPEC 1.023 09/08/2015 1734   PHURINE 6.0 09/08/2015 1734   GLUCOSEU NEGATIVE 09/08/2015 1734   HGBUR SMALL (A) 09/08/2015 1734   BILIRUBINUR NEGATIVE 09/08/2015 1734   KETONESUR 40 (A) 09/08/2015 1734   PROTEINUR NEGATIVE  09/08/2015 1734   UROBILINOGEN 1.0 11/16/2007 2229   NITRITE NEGATIVE 09/08/2015 1734   LEUKOCYTESUR NEGATIVE 09/08/2015 1734   Sepsis Labs Invalid input(s): PROCALCITONIN,  WBC,  LACTICIDVEN Microbiology No results found for this or any previous visit (from the past 240 hour(s)).   Time coordinating discharge: Over 30 minutes  SIGNED:   Kathlen ModyAKULA,Alesi Zachery, MD  [facility] 01/08/2017, 2:43 PM Pager   If 7PM-7AM, please contact night-coverage [email or web address] Password [email or web address]

## 2018-09-18 ENCOUNTER — Emergency Department (HOSPITAL_COMMUNITY)
Admission: EM | Admit: 2018-09-18 | Discharge: 2018-09-19 | Discharge status: Against Medical Advice | Payer: No Typology Code available for payment source | Source: Home / Self Care

## 2018-09-18 ENCOUNTER — Emergency Department (HOSPITAL_COMMUNITY): Payer: No Typology Code available for payment source

## 2018-09-18 ENCOUNTER — Encounter (HOSPITAL_COMMUNITY): Payer: Self-pay | Admitting: Emergency Medicine

## 2018-09-18 DIAGNOSIS — Z87891 Personal history of nicotine dependence: Secondary | ICD-10-CM

## 2018-09-18 DIAGNOSIS — M79672 Pain in left foot: Secondary | ICD-10-CM | POA: Diagnosis not present

## 2018-09-18 DIAGNOSIS — M25512 Pain in left shoulder: Secondary | ICD-10-CM | POA: Insufficient documentation

## 2018-09-18 NOTE — ED Triage Notes (Signed)
Per Dr Judd Lien no Trauma Activation
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
## 2018-09-19 ENCOUNTER — Emergency Department (HOSPITAL_COMMUNITY)
Admission: EM | Admit: 2018-09-19 | Discharge: 2018-09-19 | Disposition: A | Discharge status: Home / Self Care | Payer: No Typology Code available for payment source | Attending: Emergency Medicine | Admitting: Emergency Medicine

## 2018-09-19 ENCOUNTER — Emergency Department (HOSPITAL_COMMUNITY): Payer: No Typology Code available for payment source

## 2018-09-19 DIAGNOSIS — M79672 Pain in left foot: Secondary | ICD-10-CM

## 2018-09-19 DIAGNOSIS — M25512 Pain in left shoulder: Secondary | ICD-10-CM

## 2018-09-19 DIAGNOSIS — Z87891 Personal history of nicotine dependence: Secondary | ICD-10-CM | POA: Insufficient documentation

## 2018-09-19 MED ORDER — KETOROLAC TROMETHAMINE 15 MG/ML IJ SOLN
15.0000 mg | Freq: Once | INTRAMUSCULAR | Status: AC
  Administered 2018-09-19: 15 mg via INTRAMUSCULAR
  Filled 2018-09-19: qty 1
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
## 2018-09-19 NOTE — ED Notes (Signed)
Pt c/o pain in left shoulder- cooperative,
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
## 2018-09-19 NOTE — ED Triage Notes (Signed)
See prior notes, pt just left AMA. Pt here for shoulder pain.
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
## 2018-09-19 NOTE — ED Notes (Signed)
Patient transported to X-ray 

## 2021-12-14 ENCOUNTER — Encounter (HOSPITAL_COMMUNITY): Payer: Self-pay | Admitting: Emergency Medicine

## 2021-12-14 ENCOUNTER — Emergency Department (HOSPITAL_COMMUNITY): Payer: Self-pay

## 2021-12-14 ENCOUNTER — Emergency Department (HOSPITAL_COMMUNITY)
Admission: EM | Admit: 2021-12-14 | Discharge: 2021-12-15 | Disposition: A | Discharge status: Home / Self Care | Payer: Self-pay | Attending: Emergency Medicine | Admitting: Emergency Medicine

## 2021-12-14 DIAGNOSIS — R1013 Epigastric pain: Secondary | ICD-10-CM | POA: Insufficient documentation

## 2021-12-14 DIAGNOSIS — R1033 Periumbilical pain: Secondary | ICD-10-CM | POA: Insufficient documentation

## 2021-12-14 LAB — CBC
HCT: 41 % (ref 39.0–52.0)
Hemoglobin: 13.7 g/dL (ref 13.0–17.0)
MCH: 29.9 pg (ref 26.0–34.0)
MCHC: 33.4 g/dL (ref 30.0–36.0)
MCV: 89.5 fL (ref 80.0–100.0)
Platelets: 270 10*3/uL (ref 150–400)
RBC: 4.58 MIL/uL (ref 4.22–5.81)
RDW: 14 % (ref 11.5–15.5)
WBC: 5.5 10*3/uL (ref 4.0–10.5)
nRBC: 0 % (ref 0.0–0.2)

## 2021-12-14 LAB — URINALYSIS, ROUTINE W REFLEX MICROSCOPIC
Bacteria, UA: NONE SEEN
Bilirubin Urine: NEGATIVE
Glucose, UA: NEGATIVE mg/dL
Ketones, ur: 80 mg/dL — AB
Leukocytes,Ua: NEGATIVE
Nitrite: NEGATIVE
Protein, ur: 30 mg/dL — AB
RBC / HPF: >50 RBC/hpf — ABNORMAL HIGH (ref 0–5)
Specific Gravity, Urine: 1.023 (ref 1.005–1.030)
pH: 6 (ref 5.0–8.0)

## 2021-12-14 LAB — COMPREHENSIVE METABOLIC PANEL
ALT: 18 U/L (ref 0–44)
AST: 30 U/L (ref 15–41)
Albumin: 4.3 g/dL (ref 3.5–5.0)
Alkaline Phosphatase: 58 U/L (ref 38–126)
Anion gap: 10 (ref 5–15)
BUN: 9 mg/dL (ref 8–23)
CO2: 24 mmol/L (ref 22–32)
Calcium: 9 mg/dL (ref 8.9–10.3)
Chloride: 107 mmol/L (ref 98–111)
Creatinine, Ser: 0.72 mg/dL (ref 0.61–1.24)
GFR, Estimated: >60 mL/min (ref >60)
Glucose, Bld: 131 mg/dL — ABNORMAL HIGH (ref 70–99)
Potassium: 3.7 mmol/L (ref 3.5–5.1)
Sodium: 141 mmol/L (ref 135–145)
Total Bilirubin: 1.6 mg/dL — ABNORMAL HIGH (ref 0.3–1.2)
Total Protein: 7.4 g/dL (ref 6.5–8.1)

## 2021-12-14 LAB — LIPASE, BLOOD: Lipase: 32 U/L (ref 11–51)

## 2021-12-14 MED ORDER — FAMOTIDINE IN NACL 20-0.9 MG/50ML-% IV SOLN
20.0000 mg | Freq: Once | INTRAVENOUS | Status: AC
  Administered 2021-12-14: 20 mg via INTRAVENOUS
  Filled 2021-12-14: qty 50

## 2021-12-14 MED ORDER — IOHEXOL 300 MG/ML  SOLN
100.0000 mL | Freq: Once | INTRAMUSCULAR | Status: AC | PRN
  Administered 2021-12-14: 100 mL via INTRAVENOUS

## 2021-12-14 MED ORDER — DICYCLOMINE HCL 10 MG PO CAPS
20.0000 mg | ORAL_CAPSULE | Freq: Once | ORAL | Status: AC
  Administered 2021-12-14: 20 mg via ORAL
  Filled 2021-12-14: qty 2

## 2021-12-14 MED ORDER — SODIUM CHLORIDE 0.9 % IV BOLUS
1000.0000 mL | Freq: Once | INTRAVENOUS | Status: AC
  Administered 2021-12-14: 1000 mL via INTRAVENOUS

## 2021-12-14 MED ORDER — ONDANSETRON 4 MG PO TBDP
4.0000 mg | ORAL_TABLET | Freq: Once | ORAL | Status: DC | PRN
  Filled 2021-12-14: qty 1

## 2021-12-14 MED ORDER — ONDANSETRON HCL 4 MG/2ML IJ SOLN
4.0000 mg | Freq: Once | INTRAMUSCULAR | Status: AC
  Administered 2021-12-14: 4 mg via INTRAVENOUS
  Filled 2021-12-14: qty 2

## 2021-12-14 MED ORDER — MORPHINE SULFATE (PF) 4 MG/ML IV SOLN
4.0000 mg | Freq: Once | INTRAVENOUS | Status: AC
  Administered 2021-12-14: 4 mg via INTRAVENOUS
  Filled 2021-12-14: qty 1

## 2021-12-14 NOTE — ED Provider Notes (Signed)
?[facility] ?Provider Note ? ? ?CSN: [id number] ?Arrival date & time: 12/14/21  1228 ? ?  ? ?History ? ?Chief Complaint  ?Patient presents with  ? Abdominal Pain  ? ? ?Thomas Hinton is a 66 y.o. male. ? ?Hi no elevation in AST ALT or alk phos.  No no ? ? ?Abdominal Pain ? ?Patient is a 66 year old male with a past medical history of elevated blood pressure without diagnosis of hypertension.  He states he takes no medications and has no known medical problems. ? ?He is presented emergency room today with generalized abdominal pain Although he indicates more of an epigastric/upper abdominal pain.  States it has been going on for 4 days.  He states his that it is 7/10 constant aching pain has been persistent since he started experiencing nausea and nonbloody nonbilious emesis 4 days ago.  He states he has 5-6 episodes a day.  Denies any diarrhea or blood in the stool.  He states he has a history of heavy alcohol use but has not drank alcohol in 3 months.  Denies any dark or tarry stools. ? ? ? ?  ? ?Home Medications ?Prior to Admission medications   ?Medication Sig Start Date End Date Taking? Authorizing Provider  ?dicyclomine (BENTYL) 20 MG tablet Take 1 tablet (20 mg total) by mouth 2 (two) times daily. 12/15/21  Yes Tedd Sias, PA  ?metoCLOPramide (REGLAN) 10 MG tablet Take 1 tablet (10 mg total) by mouth every 6 (six) hours. 12/15/21  Yes Sayaka Hoeppner, Ova Freshwater S, PA  ?pantoprazole (PROTONIX) 20 MG tablet Take 1 tablet (20 mg total) by mouth daily. 12/15/21  Yes Tedd Sias, PA  ?   ? ?Allergies    ?Penicillins   ? ?Review of Systems   ?Review of Systems  ?Gastrointestinal:  Positive for abdominal pain.  ? ?Physical Exam ?Updated Vital Signs ?BP (!) 108/57   Pulse (!) 48   Temp 98.5 ?F (36.9 ?C) (Oral)   Resp 18   SpO2 96%  ?Physical Exam ?Vitals and nursing note reviewed.  ?Constitutional:   ?   General: He is not in acute distress. ?HENT:  ?   Head: Normocephalic and  atraumatic.  ?   Nose: Nose normal.  ?Eyes:  ?   General: No scleral icterus. ?Cardiovascular:  ?   Rate and Rhythm: Normal rate and regular rhythm.  ?   Pulses: Normal pulses.  ?   Heart sounds: Normal heart sounds.  ?Pulmonary:  ?   Effort: Pulmonary effort is normal. No respiratory distress.  ?   Breath sounds: No wheezing.  ?Abdominal:  ?   Palpations: Abdomen is soft.  ?   Tenderness: There is abdominal tenderness in the epigastric area and periumbilical area. Negative signs include Murphy's sign, Rovsing's sign and McBurney's sign.  ?Musculoskeletal:  ?   Cervical back: Normal range of motion.  ?   Right lower leg: No edema.  ?   Left lower leg: No edema.  ?Skin: ?   General: Skin is warm and dry.  ?   Capillary Refill: Capillary refill takes less than 2 seconds.  ?Neurological:  ?   Mental Status: He is alert. Mental status is at baseline.  ?Psychiatric:     ?   Mood and Affect: Mood normal.     ?   Behavior: Behavior normal.  ? ? ?ED Results / Procedures / Treatments   ?Labs ?(all labs ordered are listed, but only abnormal results are displayed) ?  Labs Reviewed  ?COMPREHENSIVE METABOLIC PANEL - Abnormal; Notable for the following components:  ?    Result Value  ? Glucose, Bld 131 (*)   ? Total Bilirubin 1.6 (*)   ? All other components within normal limits  ?URINALYSIS, ROUTINE W REFLEX MICROSCOPIC - Abnormal; Notable for the following components:  ? Hgb urine dipstick MODERATE (*)   ? Ketones, ur 80 (*)   ? Protein, ur 30 (*)   ? RBC / HPF >50 (*)   ? All other components within normal limits  ?LIPASE, BLOOD  ?CBC  ? ? ?EKG ?None ? ?Radiology ?CT ABDOMEN PELVIS W CONTRAST ? ?Result Date: 12/15/2021 ?CLINICAL DATA:  Diffuse and epigastric abdominal pain.  Vomiting. EXAM: CT ABDOMEN AND PELVIS WITH CONTRAST TECHNIQUE: Multidetector CT imaging of the abdomen and pelvis was performed using the standard protocol following bolus administration of intravenous contrast. RADIATION DOSE REDUCTION: This exam was  performed according to the departmental dose-optimization program which includes automated exposure control, adjustment of the mA and/or kV according to patient size and/or use of iterative reconstruction technique. CONTRAST:  123m OMNIPAQUE IOHEXOL 300 MG/ML  SOLN COMPARISON:  11/16/2007. FINDINGS: Lower chest: No acute abnormality. Hepatobiliary: Multiple cysts are present within the liver. No biliary ductal dilatation. The gallbladder is without stones. Pancreas: Unremarkable. No pancreatic ductal dilatation or surrounding inflammatory changes. Spleen: Normal in size without focal abnormality. Adrenals/Urinary Tract: The adrenal glands are within normal limits. Cysts are present in the kidneys bilaterally. There is a punctate renal calculus on the left. No hydronephrosis bilaterally. The bladder is within normal limits. Stomach/Bowel: There is mild thickening of the gastric rugae. No bowel obstruction, free air, or pneumatosis. A few scattered diverticula are present along the colon without evidence of diverticulitis. The appendix is not visualized on exam. Vascular/Lymphatic: Aortic atherosclerosis. No enlarged abdominal or pelvic lymph nodes. Reproductive: Prostate is unremarkable. Other: No ascites. Musculoskeletal: Degenerative changes are present in the thoracolumbar spine. No acute osseous abnormality. IMPRESSION: 1. Mild thickening of the gastric rugae, possible gastritis. 2. No bowel obstruction or free air. 3. The appendix is not visualized on exam. Clinical correlation is recommended. 4. Bilateral renal cysts and hepatic cysts. Nonobstructive left renal calculus. 5. Aortic atherosclerosis. Electronically Signed   By: LBrett FairyM.D.   On: 12/15/2021 00:12   ? ?Procedures ?Procedures  ? ? ?Medications Ordered in ED ?Medications  ?ondansetron (ZOFRAN-ODT) disintegrating tablet 4 mg (has no administration in time range)  ?sodium chloride 0.9 % bolus 1,000 mL (0 mLs Intravenous Stopped 12/15/21 0046)   ?morphine (PF) 4 MG/ML injection 4 mg (4 mg Intravenous Given 12/14/21 2340)  ?ondansetron ([facility] injection 4 mg (4 mg Intravenous Given 12/14/21 2339)  ?dicyclomine (BENTYL) capsule 20 mg (20 mg Oral Given 12/14/21 2341)  ?famotidine (PEPCID) IVPB 20 mg premix (0 mg Intravenous Stopped 12/15/21 0046)  ?iohexol (OMNIPAQUE) 300 MG/ML solution 100 mL (100 mLs Intravenous Contrast Given 12/14/21 2350)  ? ? ?ED Course/ Medical Decision Making/ A&P ?Clinical Course as of 12/15/21 0411  ?Tue Dec 14, 2021  ?2307 Nausea and vomiting for 4 days.  ?Can keep some things down but vomits frequently per day (5 times today) [WF]  ?Wed Dec 15, 2021  ?0024 IMPRESSION: ?1. Mild thickening of the gastric rugae, possible gastritis. ?2. No bowel obstruction or free air. ?3. The appendix is not visualized on exam. Clinical correlation is ?recommended. ?4. Bilateral renal cysts and hepatic cysts. Nonobstructive left ?renal calculus. ?5. Aortic atherosclerosis. ? ? [WF]  ?  ?  Clinical Course User Index ?[WF] [place], Wylder S, PA  ? ?                        ?Medical Decision Making ?Amount and/or Complexity of Data Reviewed ?Labs: ordered. ?Radiology: ordered. ? ?Risk ?Prescription drug management. ? ? ?This patient presents to the ED for concern of abdominal pain, this involves a number of treatment options, and is a complaint that carries with it a high risk of complications and morbidity.  The differential diagnosis includes The causes of generalized abdominal pain include but are not limited to AAA, mesenteric ischemia, appendicitis, diverticulitis, DKA, gastritis, gastroenteritis, AMI, nephrolithiasis, pancreatitis, peritonitis, adrenal insufficiency,lead poisoning, iron toxicity, intestinal ischemia, constipation, UTI,SBO/LBO, splenic rupture, biliary disease, IBD, IBS, PUD, or hepatitis. ? ? ? ?Co morbidities: ?Discussed in HPI ? ? ?Brief History: ? ?Patient is a 65-year-old male with a past medical history of elevated blood pressure  without diagnosis of hypertension.  He states he takes no medications and has no known medical problems. ? ?He is presented emergency room today with generalized abdominal pain Although he indicates more of an epigastric/up

## 2021-12-14 NOTE — ED Triage Notes (Signed)
Per EMS, complaining of abdominal pain for the last 5 days-vomited 5 times twice with EMS ?

## 2021-12-14 NOTE — ED Notes (Signed)
Pt did not answer for vital recheck 

## 2021-12-15 MED ORDER — PANTOPRAZOLE SODIUM 20 MG PO TBEC: 20.0000 mg | DELAYED_RELEASE_TABLET | Freq: Every day | ORAL | 0 refills | Status: DC

## 2021-12-15 MED ORDER — DICYCLOMINE HCL 20 MG PO TABS: 20.0000 mg | ORAL_TABLET | Freq: Two times a day (BID) | ORAL | 0 refills | Status: DC

## 2021-12-15 MED ORDER — METOCLOPRAMIDE HCL 10 MG PO TABS: 10.0000 mg | ORAL_TABLET | Freq: Four times a day (QID) | ORAL | 0 refills | Status: DC

## 2021-12-15 NOTE — Discharge Instructions (Addendum)
As we discussed your CT scan was without any emergent findings.  You do have some inflammation around your stomach which is consistent with gastritis.  I would let you feels much better I suspect that some of this relief is from the Pepcid that I gave you.  You do have some cysts on your kidneys and liver.  Please follow-up with your primary care provider to talk more about this.  Please take the medications prescribed as prescribed. ? ?Please refrain from ibuprofen, Aleve, meloxicam or any alcohol as any this can cause more irritation to your stomach lining. ?

## 2022-05-24 DIAGNOSIS — H524 Presbyopia: Secondary | ICD-10-CM | POA: Diagnosis not present

## 2022-05-24 DIAGNOSIS — H5213 Myopia, bilateral: Secondary | ICD-10-CM | POA: Diagnosis not present

## 2022-07-21 ENCOUNTER — Emergency Department (HOSPITAL_COMMUNITY)
Admission: EM | Admit: 2022-07-21 | Discharge: 2022-07-21 | Disposition: A | Discharge status: Home / Self Care | Payer: Medicare HMO | Attending: Emergency Medicine | Admitting: Emergency Medicine

## 2022-07-21 ENCOUNTER — Other Ambulatory Visit: Payer: Self-pay

## 2022-07-21 DIAGNOSIS — R112 Nausea with vomiting, unspecified: Secondary | ICD-10-CM | POA: Insufficient documentation

## 2022-07-21 DIAGNOSIS — R1013 Epigastric pain: Secondary | ICD-10-CM | POA: Diagnosis not present

## 2022-07-21 DIAGNOSIS — R109 Unspecified abdominal pain: Secondary | ICD-10-CM | POA: Diagnosis present

## 2022-07-21 DIAGNOSIS — R1033 Periumbilical pain: Secondary | ICD-10-CM | POA: Insufficient documentation

## 2022-07-21 DIAGNOSIS — R1010 Upper abdominal pain, unspecified: Secondary | ICD-10-CM

## 2022-07-21 LAB — CBC WITH DIFFERENTIAL/PLATELET
Abs Immature Granulocytes: 0.01 10*3/uL (ref 0.00–0.07)
Basophils Absolute: 0 10*3/uL (ref 0.0–0.1)
Basophils Relative: 0 %
Eosinophils Absolute: 0 10*3/uL (ref 0.0–0.5)
Eosinophils Relative: 0 %
HCT: 43.5 % (ref 39.0–52.0)
Hemoglobin: 14.7 g/dL (ref 13.0–17.0)
Immature Granulocytes: 0 %
Lymphocytes Relative: 17 %
Lymphs Abs: 1.2 10*3/uL (ref 0.7–4.0)
MCH: 29.7 pg (ref 26.0–34.0)
MCHC: 33.8 g/dL (ref 30.0–36.0)
MCV: 87.9 fL (ref 80.0–100.0)
Monocytes Absolute: 0.8 10*3/uL (ref 0.1–1.0)
Monocytes Relative: 11 %
Neutro Abs: 5 10*3/uL (ref 1.7–7.7)
Neutrophils Relative %: 72 %
Platelets: 255 10*3/uL (ref 150–400)
RBC: 4.95 MIL/uL (ref 4.22–5.81)
RDW: 14 % (ref 11.5–15.5)
WBC: 7 10*3/uL (ref 4.0–10.5)
nRBC: 0 % (ref 0.0–0.2)

## 2022-07-21 LAB — URINALYSIS, ROUTINE W REFLEX MICROSCOPIC
Bilirubin Urine: NEGATIVE
Glucose, UA: NEGATIVE mg/dL
Ketones, ur: 20 mg/dL — AB
Leukocytes,Ua: NEGATIVE
Nitrite: NEGATIVE
Protein, ur: 100 mg/dL — AB
Specific Gravity, Urine: 1.029 (ref 1.005–1.030)
pH: 5 (ref 5.0–8.0)

## 2022-07-21 LAB — COMPREHENSIVE METABOLIC PANEL
ALT: 18 U/L (ref 0–44)
AST: 23 U/L (ref 15–41)
Albumin: 4.2 g/dL (ref 3.5–5.0)
Alkaline Phosphatase: 67 U/L (ref 38–126)
Anion gap: 10 (ref 5–15)
BUN: 24 mg/dL — ABNORMAL HIGH (ref 8–23)
CO2: 30 mmol/L (ref 22–32)
Calcium: 9.2 mg/dL (ref 8.9–10.3)
Chloride: 100 mmol/L (ref 98–111)
Creatinine, Ser: 0.93 mg/dL (ref 0.61–1.24)
GFR, Estimated: >60 mL/min (ref >60)
Glucose, Bld: 118 mg/dL — ABNORMAL HIGH (ref 70–99)
Potassium: 3.5 mmol/L (ref 3.5–5.1)
Sodium: 140 mmol/L (ref 135–145)
Total Bilirubin: 2.1 mg/dL — ABNORMAL HIGH (ref 0.3–1.2)
Total Protein: 7.6 g/dL (ref 6.5–8.1)

## 2022-07-21 LAB — LIPASE, BLOOD: Lipase: 38 U/L (ref 11–51)

## 2022-07-21 MED ORDER — ALUM & MAG HYDROXIDE-SIMETH 200-200-20 MG/5ML PO SUSP
30.0000 mL | Freq: Once | ORAL | Status: AC
  Administered 2022-07-21: 30 mL via ORAL
  Filled 2022-07-21: qty 30

## 2022-07-21 MED ORDER — FAMOTIDINE 20 MG PO TABS
20.0000 mg | ORAL_TABLET | Freq: Once | ORAL | Status: AC
  Administered 2022-07-21: 20 mg via ORAL
  Filled 2022-07-21: qty 1

## 2022-07-21 MED ORDER — ONDANSETRON HCL 4 MG/2ML IJ SOLN
4.0000 mg | Freq: Once | INTRAMUSCULAR | Status: AC
  Administered 2022-07-21: 4 mg via INTRAVENOUS
  Filled 2022-07-21: qty 2

## 2022-07-21 MED ORDER — LIDOCAINE VISCOUS HCL 2 % MT SOLN
15.0000 mL | Freq: Once | OROMUCOSAL | Status: AC
  Administered 2022-07-21: 15 mL via OROMUCOSAL
  Filled 2022-07-21: qty 15

## 2022-07-21 MED ORDER — METOCLOPRAMIDE HCL 10 MG PO TABS: 10.0000 mg | ORAL_TABLET | Freq: Four times a day (QID) | ORAL | 0 refills | Status: DC

## 2022-07-21 MED ORDER — DICYCLOMINE HCL 10 MG/5ML PO SOLN
10.0000 mg | Freq: Once | ORAL | Status: DC
  Filled 2022-07-21: qty 5

## 2022-07-21 MED ORDER — PANTOPRAZOLE SODIUM 20 MG PO TBEC: 20.0000 mg | DELAYED_RELEASE_TABLET | Freq: Every day | ORAL | 0 refills | Status: DC

## 2022-07-21 MED ORDER — SODIUM CHLORIDE 0.9 % IV BOLUS
1000.0000 mL | Freq: Once | INTRAVENOUS | Status: AC
  Administered 2022-07-21: 1000 mL via INTRAVENOUS

## 2022-07-21 NOTE — ED Provider Notes (Signed)
[facility]-EMERGENCY DEPT Provider Note   CSN: [id number] Arrival date & time: 07/21/22  1021     History  Chief Complaint  Patient presents with   Abdominal Pain    Thomas Hinton is a 66 y.o. male.  He presents to the ED with 3 days of burning umbilical abdominal pain as well as burning sensation up into his throat. He says he has had associated nausea and vomiting with poor appetite.  He also reports a little bit of diarrhea that was nonbloody this morning.  He said he has had similar symptoms earlier this year.  He has not followed up with any gastroenterologist and is not on any GI medications.   He denies any chest pain, shortness of breath, syncope, dizziness, urinary symptoms, flank pain.    Abdominal Pain Associated symptoms: nausea and vomiting        Home Medications Prior to Admission medications   Medication Sig Start Date End Date Taking? Authorizing Provider  dicyclomine (BENTYL) 20 MG tablet Take 1 tablet (20 mg total) by mouth 2 (two) times daily. 12/15/21   Gailen Shelter, PA  metoCLOPramide (REGLAN) 10 MG tablet Take 1 tablet (10 mg total) by mouth every 6 (six) hours for 7 days. 07/21/22 07/28/22  Casmir Auguste, Finis Bud, PA-C  pantoprazole (PROTONIX) 20 MG tablet Take 1 tablet (20 mg total) by mouth daily. 07/21/22 08/20/22  Panagiotis Oelkers, Finis Bud, PA-C      Allergies    Penicillins    Review of Systems   Review of Systems  Gastrointestinal:  Positive for abdominal pain, nausea and vomiting.  All other systems reviewed and are negative.   Physical Exam Updated Vital Signs BP (!) 138/93   Pulse (!) 50   Temp 99.3 F (37.4 C) (Oral)   Resp 15   Ht 5\' 11"  (1.803 m)   Wt 98 kg   SpO2 92%   BMI 30.13 kg/m  Physical Exam Vitals and nursing note reviewed.  Constitutional:      General: He is not in acute distress.    Appearance: Normal appearance. He is not ill-appearing, toxic-appearing or diaphoretic.  HENT:     Head:  Normocephalic and atraumatic.     Nose: No nasal deformity.     Mouth/Throat:     Lips: Pink. No lesions.     Mouth: Mucous membranes are moist. No injury, lacerations, oral lesions or angioedema.     Pharynx: Oropharynx is clear. Uvula midline. No pharyngeal swelling, oropharyngeal exudate, posterior oropharyngeal erythema or uvula swelling.  Eyes:     General: Gaze aligned appropriately. No scleral icterus.       Right eye: No discharge.        Left eye: No discharge.     Conjunctiva/sclera: Conjunctivae normal.     Right eye: Right conjunctiva is not injected. No exudate or hemorrhage.    Left eye: Left conjunctiva is not injected. No exudate or hemorrhage.    Pupils: Pupils are equal, round, and reactive to light.  Cardiovascular:     Rate and Rhythm: Normal rate and regular rhythm.     Pulses: Normal pulses.          Radial pulses are 2+ on the right side and 2+ on the left side.       Dorsalis pedis pulses are 2+ on the right side and 2+ on the left side.     Heart sounds: Normal heart sounds, S1 normal and S2 normal. Heart sounds not  distant. No murmur heard.    No friction rub. No gallop. No S3 or S4 sounds.  Pulmonary:     Effort: Pulmonary effort is normal. No accessory muscle usage or respiratory distress.     Breath sounds: Normal breath sounds. No stridor. No wheezing, rhonchi or rales.  Chest:     Chest wall: No tenderness.  Abdominal:     General: Abdomen is flat. There is no distension.     Palpations: Abdomen is soft. There is no mass or pulsatile mass.     Tenderness: There is abdominal tenderness in the epigastric area and periumbilical area. There is no right CVA tenderness, left CVA tenderness, guarding or rebound. Negative signs include Murphy's sign and McBurney's sign.     Hernia: No hernia is present.     Comments: Mild abdominal tenderness mostly in center/epigastric region  Musculoskeletal:     Right lower leg: No edema.     Left lower leg: No edema.   Skin:    General: Skin is warm and dry.     Coloration: Skin is not jaundiced or pale.     Findings: No bruising, erythema, lesion or rash.  Neurological:     General: No focal deficit present.     Mental Status: He is alert and oriented to person, place, and time.     GCS: GCS eye subscore is 4. GCS verbal subscore is 5. GCS motor subscore is 6.  Psychiatric:        Mood and Affect: Mood normal.        Behavior: Behavior normal. Behavior is cooperative.     ED Results / Procedures / Treatments   Labs (all labs ordered are listed, but only abnormal results are displayed) Labs Reviewed  COMPREHENSIVE METABOLIC PANEL - Abnormal; Notable for the following components:      Result Value   Glucose, Bld 118 (*)    BUN 24 (*)    Total Bilirubin 2.1 (*)    All other components within normal limits  URINALYSIS, ROUTINE W REFLEX MICROSCOPIC - Abnormal; Notable for the following components:   Hgb urine dipstick MODERATE (*)    Ketones, ur 20 (*)    Protein, ur 100 (*)    Bacteria, UA RARE (*)    All other components within normal limits  CBC WITH DIFFERENTIAL/PLATELET  LIPASE, BLOOD    EKG None  Radiology No results found.  Procedures Procedures  This patient was on telemetry or cardiac monitoring during their time in the ED.    Medications Ordered in ED Medications  dicyclomine (BENTYL) 10 MG/5ML solution 10 mg (has no administration in time range)  sodium chloride 0.9 % bolus 1,000 mL (1,000 mLs Intravenous New Bag/Given 07/21/22 1107)  ondansetron (ZOFRAN) injection 4 mg (4 mg Intravenous Given 07/21/22 1106)  alum & mag hydroxide-simeth (MAALOX/MYLANTA) 200-200-20 MG/5ML suspension 30 mL (30 mLs Oral Given 07/21/22 1106)  lidocaine (XYLOCAINE) 2 % viscous mouth solution 15 mL (15 mLs Mouth/Throat Given 07/21/22 1106)  famotidine (PEPCID) tablet 20 mg (20 mg Oral Given 07/21/22 1107)    ED Course/ Medical Decision Making/ A&P Clinical Course as of 07/21/22 1403  Thu  Jul 21, 2022  1330 Reassessment reveals improvement of symptoms, continued benign abdominal exam. No recurrent vomiting. He will need PO challenge [GL]    Clinical Course User Index [GL] Dade Rodin, Finis Bud, PA-C  Medical Decision Making Amount and/or Complexity of Data Reviewed Labs: ordered.  Risk OTC drugs. Prescription drug management.    MDM  This is a 66 y.o. male who presents to the ED with 4 days of epigastric/umbilical burning with associated nausea/vomiting The differential of this patient includes but is not limited to GERD, Gastritis, PUD, gallbladder pathology, perforation, pancreatitis, etc.   Initial Impression  He is afebrile, HDS, oxygenating 97% on RA No acute distress. Exam without any concerning findings that would suggest serious intraabdominal pathology. Hold off on CT right now. Will try GI cocktail, zofan, and IVF. Labs ordered. Will reassess following.   I personally ordered, reviewed, and interpreted all laboratory work and imaging and agree with radiologist interpretation. Results interpreted below: No leukocytosis or anemia noted.  Electrolytes are normal, renal function normal, LFTs normal.  T. bili 2.1 which is consistent from prior values.  Urinalysis without leukocytes.  Assessment/Plan:  Patient has overall unremarkable laboratory work-up.  On reassessment, his symptoms have completely resolved after GI cocktail.  Reassessment of abdomen is benign.  He has not had any further episodes of nausea vomiting since being here.  He has performed p.o. challenge without any difficulty.  I do not feel that he needs any further CT imaging at this time.  Recommend GI follow-up and restart PPI. He is stable for discharge.   Charting Requirements Additional history is obtained from:  Independent historian External Records from outside source obtained and reviewed including: Prior CT a/p Social Determinants of Health:  none Pertinant PMH  that complicates patient's illness: n/a  Patient Care Problems that were addressed during this visit: - Upper abdominal pain: Acute illness with systemic symptoms This patient was maintained on a cardiac monitor/telemetry. I personally viewed and interpreted the cardiac monitor which reveals an underlying rhythm of NSR Medications given in ED: GI cocktail, zofran, 1 L IVF Reevaluation of the patient after these medicines showed that the patient resolved I have reviewed home medications and made changes accordingly.  Critical Care Interventions: n/a Consultations: n/a Disposition: discharge  Portions of this note were generated with Dragon dictation software. Dictation errors may occur despite best attempts at proofreading.    Final Clinical Impression(s) / ED Diagnoses Final diagnoses:  Upper abdominal pain    Rx / DC Orders ED Discharge Orders          Ordered    pantoprazole (PROTONIX) 20 MG tablet  Daily        07/21/22 1350    metoCLOPramide (REGLAN) 10 MG tablet  Every 6 hours        07/21/22 1350              Tahje Borawski, Finis Bud, PA-C 07/21/22 1404    Alvira Monday, MD 07/22/22 2314

## 2022-07-21 NOTE — ED Triage Notes (Signed)
Ems brings pt in for abdominal pain. States this pain started 3 days ago along with nausea, vomiting. Pt reports decreased appetite.

## 2022-07-21 NOTE — Discharge Instructions (Addendum)
You were seen in the ED today for abdominal pain. Your workup has been really reassuring and you had symptom improvement with a GI cocktail. I recommend calling gastroenterology or your PCP for a follow up visit. The phone number is included in your paperwork.   I also recommend that you restart protonix to protect your stomach and I have also sent in a prescription for an antinausea medication that you can take as needed.   Please return to the ED if unable to tolerate liquids at home

## 2022-10-17 IMAGING — CT CT ABD-PELV W/ CM
2 of 5 series · 16 of 46 positions shown, 18 images · IV contrast (agent unspecified)
Comparison: 11/16/2007.

CLINICAL DATA: Diffuse and epigastric abdominal pain.  Vomiting.

EXAM:
CT ABDOMEN AND PELVIS WITH CONTRAST
TECHNIQUE: Multidetector CT imaging of the abdomen and pelvis was performed
using the standard protocol following bolus administration of
intravenous contrast.

[Series 2: axial st · axial · 0.79mm/px · z∈[-490,-95]mm · 13 of 93 slices shown, 15 images]
[im 7/93  soft-tissue]
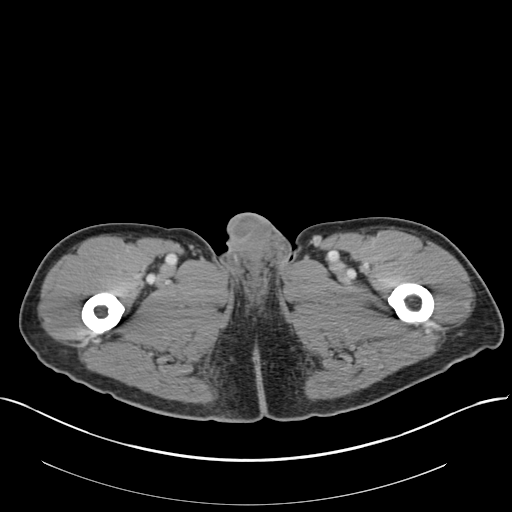
[im 7/93  bone]
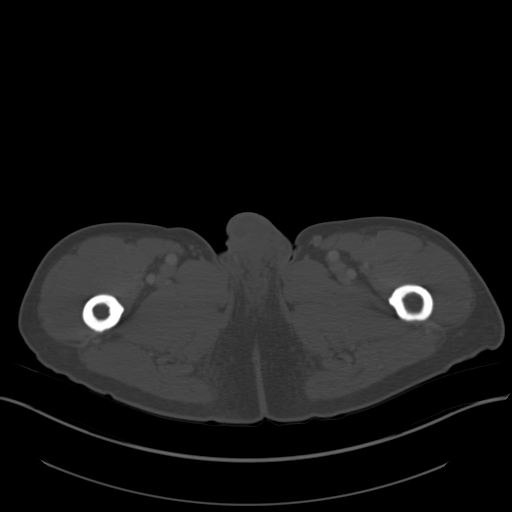
[im 14/93  soft-tissue]
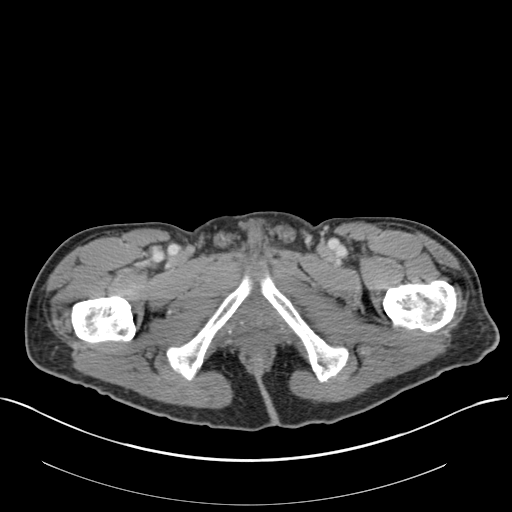
[im 20/93  soft-tissue]
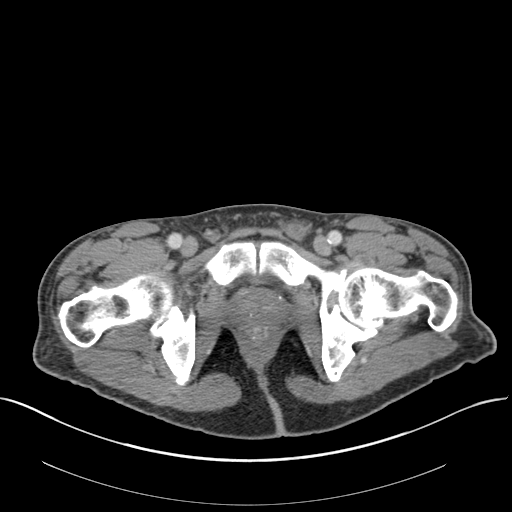
[im 27/93  soft-tissue]
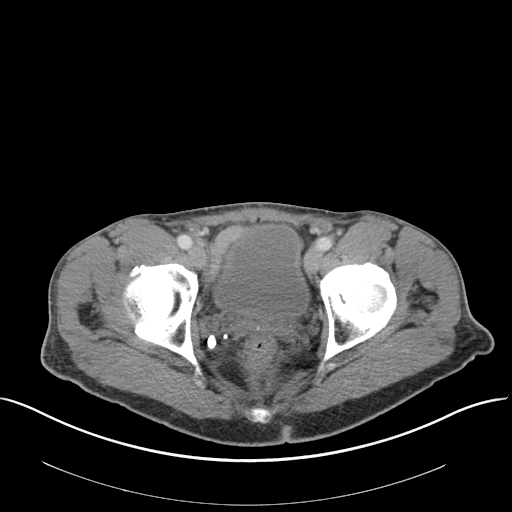
[im 33/93  soft-tissue]
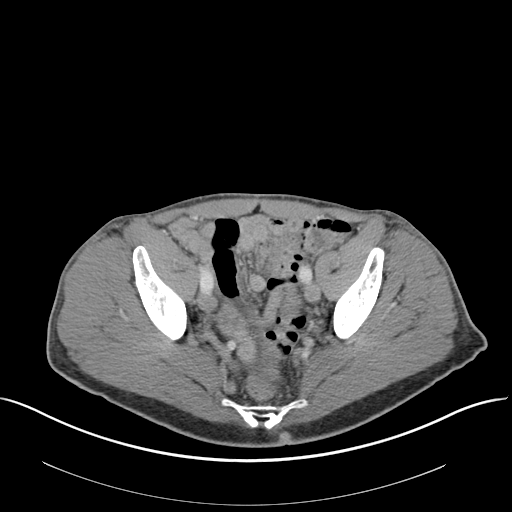
[im 40/93  soft-tissue]
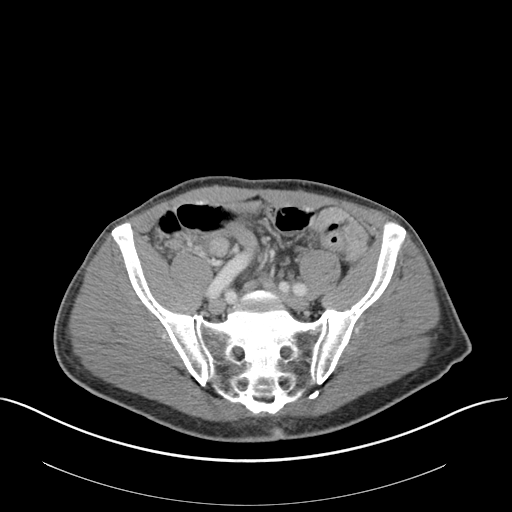
[im 47/93  soft-tissue]
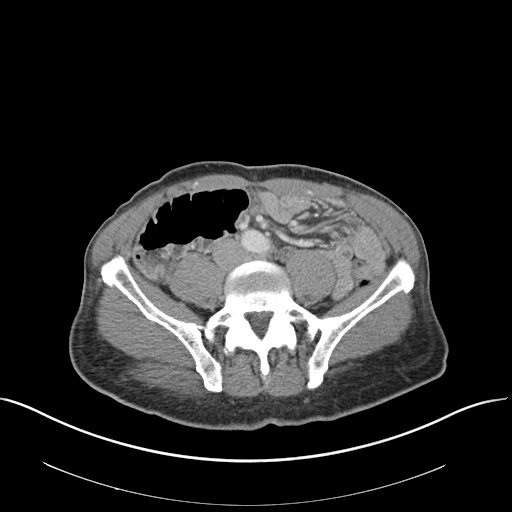
[im 53/93  soft-tissue]
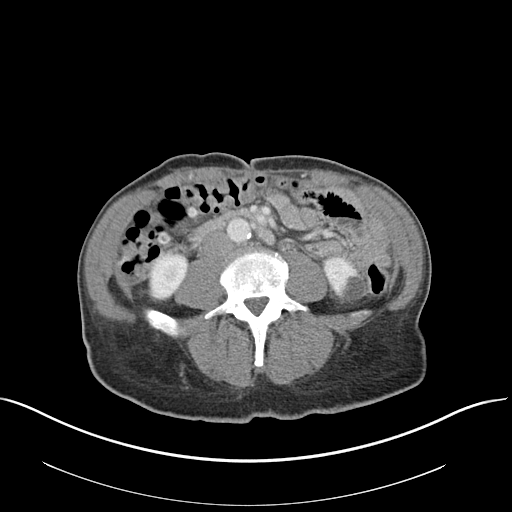
[im 60/93  soft-tissue]
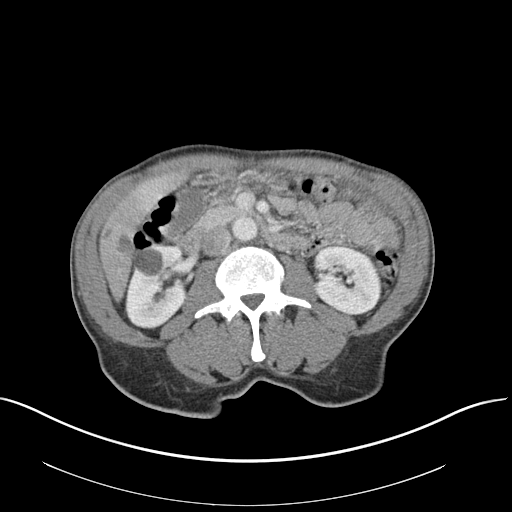
[im 60/93  bone]
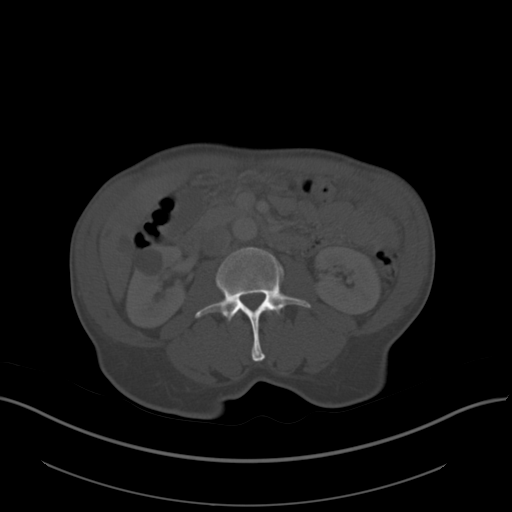
[im 66/93  soft-tissue]
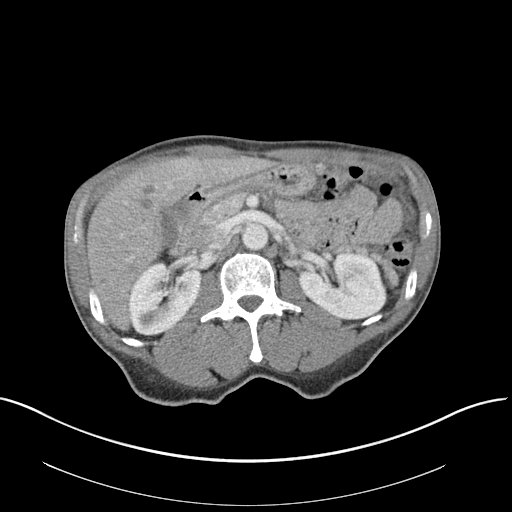
[im 73/93  soft-tissue]
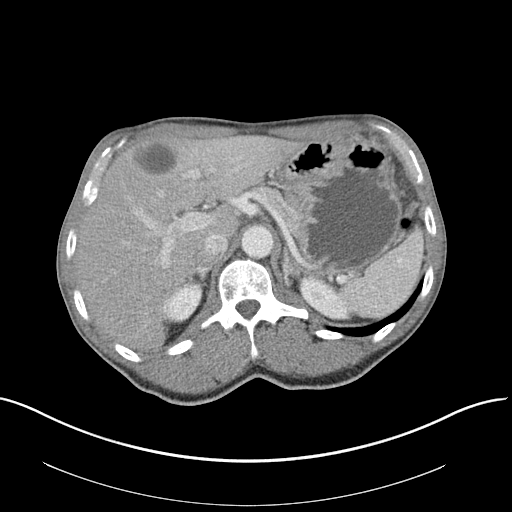
[im 79/93  soft-tissue]
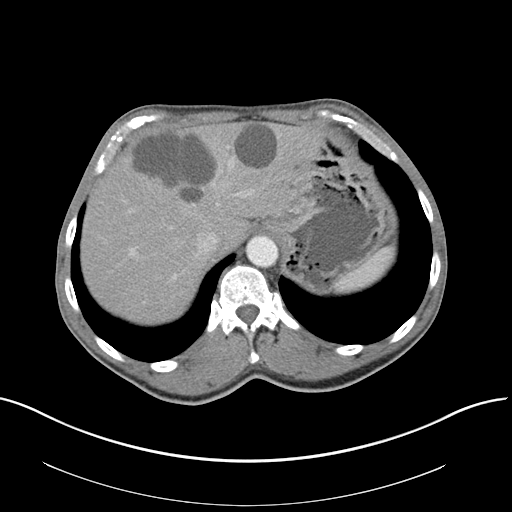
[im 86/93  soft-tissue]
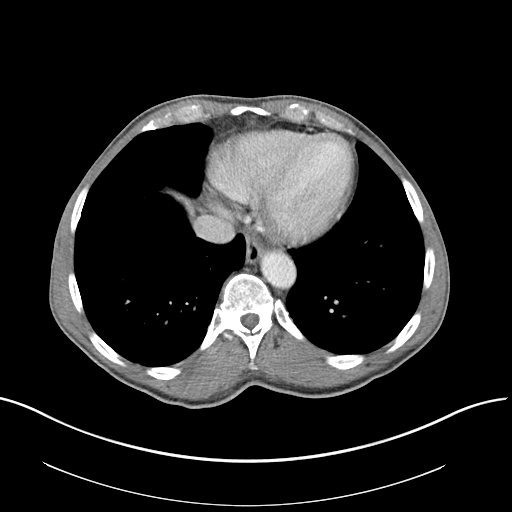

[Series 4: coronal st · coronal · 0.88mm/px · 3 of 133 slices shown]
[im 45/133  soft-tissue]
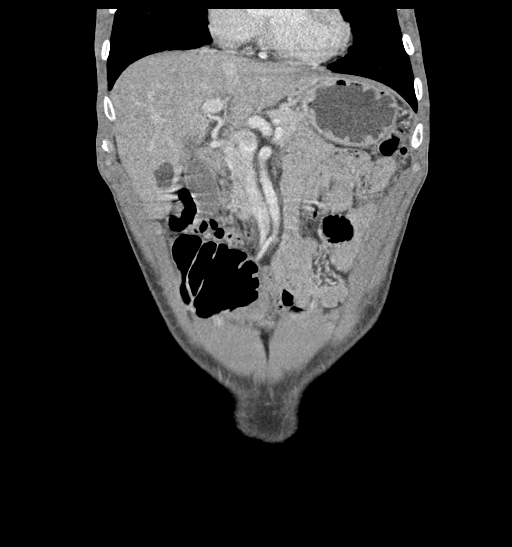
[im 59/133  soft-tissue]
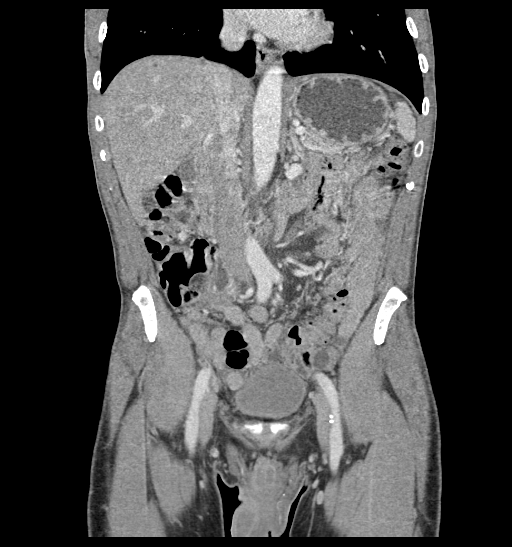
[im 74/133  soft-tissue]
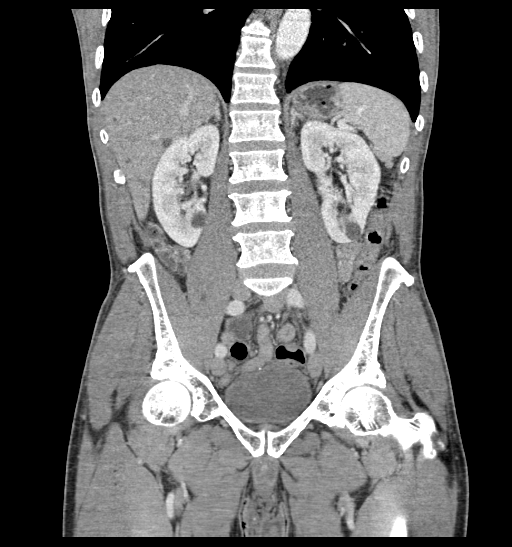

[16 of 46 positions shown; findings below may reference images not displayed]

RADIATION DOSE REDUCTION: This exam was performed according to the
departmental dose-optimization program which includes automated
exposure control, adjustment of the mA and/or kV according to
patient size and/or use of iterative reconstruction technique.

CONTRAST:  100mL OMNIPAQUE IOHEXOL 300 MG/ML  SOLN
FINDINGS: Lower chest: No acute abnormality.

Hepatobiliary: Multiple cysts are present within the liver. No
biliary ductal dilatation. The gallbladder is without stones.

Pancreas: Unremarkable. No pancreatic ductal dilatation or
surrounding inflammatory changes.

Spleen: Normal in size without focal abnormality.

Adrenals/Urinary Tract: The adrenal glands are within normal limits.
Cysts are present in the kidneys bilaterally. There is a punctate
renal calculus on the left. No hydronephrosis bilaterally. The
bladder is within normal limits.

Stomach/Bowel: There is mild thickening of the gastric rugae. No
bowel obstruction, free air, or pneumatosis. A few scattered
diverticula are present along the colon without evidence of
diverticulitis. The appendix is not visualized on exam.

Vascular/Lymphatic: Aortic atherosclerosis. No enlarged abdominal or
pelvic lymph nodes.

Reproductive: Prostate is unremarkable.

Other: No ascites.

Musculoskeletal: Degenerative changes are present in the
thoracolumbar spine. No acute osseous abnormality.
IMPRESSION: 1. Mild thickening of the gastric rugae, possible gastritis.
2. No bowel obstruction or free air.
3. The appendix is not visualized on exam. Clinical correlation is
recommended.
4. Bilateral renal cysts and hepatic cysts. Nonobstructive left
renal calculus.
5. Aortic atherosclerosis.

## 2022-11-19 ENCOUNTER — Emergency Department (HOSPITAL_COMMUNITY): Payer: Medicare HMO

## 2022-11-19 ENCOUNTER — Emergency Department (HOSPITAL_COMMUNITY)
Admission: EM | Admit: 2022-11-19 | Discharge: 2022-11-19 | Disposition: A | Discharge status: Home / Self Care | Payer: Medicare HMO | Attending: Emergency Medicine | Admitting: Emergency Medicine

## 2022-11-19 DIAGNOSIS — R112 Nausea with vomiting, unspecified: Secondary | ICD-10-CM | POA: Diagnosis not present

## 2022-11-19 DIAGNOSIS — R410 Disorientation, unspecified: Secondary | ICD-10-CM | POA: Diagnosis not present

## 2022-11-19 DIAGNOSIS — R001 Bradycardia, unspecified: Secondary | ICD-10-CM | POA: Diagnosis not present

## 2022-11-19 DIAGNOSIS — R4182 Altered mental status, unspecified: Secondary | ICD-10-CM | POA: Insufficient documentation

## 2022-11-19 DIAGNOSIS — R1010 Upper abdominal pain, unspecified: Secondary | ICD-10-CM | POA: Diagnosis present

## 2022-11-19 DIAGNOSIS — R1013 Epigastric pain: Secondary | ICD-10-CM

## 2022-11-19 LAB — CBC WITH DIFFERENTIAL/PLATELET
Abs Immature Granulocytes: 0.03 10*3/uL (ref 0.00–0.07)
Basophils Absolute: 0 10*3/uL (ref 0.0–0.1)
Basophils Relative: 0 %
Eosinophils Absolute: 0 10*3/uL (ref 0.0–0.5)
Eosinophils Relative: 0 %
HCT: 44.4 % (ref 39.0–52.0)
Hemoglobin: 15.4 g/dL (ref 13.0–17.0)
Immature Granulocytes: 0 %
Lymphocytes Relative: 18 %
Lymphs Abs: 1.3 10*3/uL (ref 0.7–4.0)
MCH: 29.7 pg (ref 26.0–34.0)
MCHC: 34.7 g/dL (ref 30.0–36.0)
MCV: 85.5 fL (ref 80.0–100.0)
Monocytes Absolute: 0.4 10*3/uL (ref 0.1–1.0)
Monocytes Relative: 5 %
Neutro Abs: 5.6 10*3/uL (ref 1.7–7.7)
Neutrophils Relative %: 77 %
Platelets: 270 10*3/uL (ref 150–400)
RBC: 5.19 MIL/uL (ref 4.22–5.81)
RDW: 13.5 % (ref 11.5–15.5)
WBC: 7.3 10*3/uL (ref 4.0–10.5)
nRBC: 0 % (ref 0.0–0.2)

## 2022-11-19 LAB — COMPREHENSIVE METABOLIC PANEL
ALT: 19 U/L (ref 0–44)
AST: 28 U/L (ref 15–41)
Albumin: 4.4 g/dL (ref 3.5–5.0)
Alkaline Phosphatase: 66 U/L (ref 38–126)
Anion gap: 12 (ref 5–15)
BUN: 16 mg/dL (ref 8–23)
CO2: 23 mmol/L (ref 22–32)
Calcium: 8.9 mg/dL (ref 8.9–10.3)
Chloride: 101 mmol/L (ref 98–111)
Creatinine, Ser: 0.82 mg/dL (ref 0.61–1.24)
GFR, Estimated: >60 mL/min (ref >60)
Glucose, Bld: 115 mg/dL — ABNORMAL HIGH (ref 70–99)
Potassium: 3.5 mmol/L (ref 3.5–5.1)
Sodium: 136 mmol/L (ref 135–145)
Total Bilirubin: 1.9 mg/dL — ABNORMAL HIGH (ref 0.3–1.2)
Total Protein: 7.4 g/dL (ref 6.5–8.1)

## 2022-11-19 LAB — ETHANOL: Alcohol, Ethyl (B): <10 mg/dL (ref <10)

## 2022-11-19 LAB — TROPONIN I (HIGH SENSITIVITY)
Troponin I (High Sensitivity): 4 ng/L (ref <18)
Troponin I (High Sensitivity): 5 ng/L (ref <18)

## 2022-11-19 LAB — LIPASE, BLOOD: Lipase: 36 U/L (ref 11–51)

## 2022-11-19 MED ORDER — ONDANSETRON HCL 4 MG/2ML IJ SOLN
4.0000 mg | Freq: Once | INTRAMUSCULAR | Status: AC
  Administered 2022-11-19: 4 mg via INTRAVENOUS
  Filled 2022-11-19: qty 2

## 2022-11-19 MED ORDER — PANTOPRAZOLE SODIUM 40 MG PO TBEC: 40.0000 mg | DELAYED_RELEASE_TABLET | Freq: Every day | ORAL | 0 refills | Status: DC

## 2022-11-19 MED ORDER — SODIUM CHLORIDE 0.9 % IV BOLUS (SEPSIS)
1000.0000 mL | Freq: Once | INTRAVENOUS | Status: AC
  Administered 2022-11-19: 1000 mL via INTRAVENOUS

## 2022-11-19 MED ORDER — IOHEXOL 300 MG/ML  SOLN
100.0000 mL | Freq: Once | INTRAMUSCULAR | Status: AC | PRN
  Administered 2022-11-19: 100 mL via INTRAVENOUS

## 2022-11-19 NOTE — Discharge Instructions (Signed)

## 2022-11-19 NOTE — ED Provider Notes (Signed)
[facility] Provider Note   CSN: [id number] Arrival date & time: 11/19/22  0305     History  Chief Complaint  Patient presents with   Abdominal Pain    Thomas Hinton is a 67 y.o. male.  The history is provided by the patient.  Patient presents because he reports that "my body is jacked up" He reports upper abdominal pain and nausea and vomiting.  Denies any diarrhea.  He reports passing flatus. Unknown what triggered these episodes.  He has had this before.  Denies any alcohol use or NSAID use. No active chest pain    EMS reported patient was unable to stay awake during transport.  He was noted to be bradycardic Home Medications Prior to Admission medications   Medication Sig Start Date End Date Taking? Authorizing Provider  pantoprazole (PROTONIX) 40 MG tablet Take 1 tablet (40 mg total) by mouth daily. 11/19/22  Yes Ripley Fraise, MD      Allergies    Penicillins    Review of Systems   Review of Systems  Constitutional:  Negative for fever.  Gastrointestinal:  Positive for vomiting.  Genitourinary:  Negative for difficulty urinating and testicular pain.    Physical Exam Updated Vital Signs BP (!) 149/89   Pulse (!) 51   Temp 98.6 F (37 C) (Oral)   Resp 12   SpO2 94%  Physical Exam CONSTITUTIONAL: Thin appearing, chronically ill-appearing HEAD: Normocephalic/atraumatic EYES: EOMI/PERRL ENMT: Mucous membranes dry NECK: supple no meningeal signs SPINE/BACK:entire spine nontender CV: S1/S2 noted, no murmurs/rubs/gallops noted LUNGS: Lungs are clear to auscultation bilaterally, no apparent distress ABDOMEN: soft, upper abdominal tenderness, no rebound or guarding, bowel sounds noted throughout abdomen GU:no cva tenderness NEURO: Pt is awake/alert/appropriate, moves all extremitiesx4.  No facial droop.  No arm or leg drift EXTREMITIES: pulses normal/equalx4, full ROM SKIN: warm, color normal PSYCH: Flat  affect  ED Results / Procedures / Treatments   Labs (all labs ordered are listed, but only abnormal results are displayed) Labs Reviewed  COMPREHENSIVE METABOLIC PANEL - Abnormal; Notable for the following components:      Result Value   Glucose, Bld 115 (*)    Total Bilirubin 1.9 (*)    All other components within normal limits  LIPASE, BLOOD  CBC WITH DIFFERENTIAL/PLATELET  ETHANOL  TROPONIN I (HIGH SENSITIVITY)  TROPONIN I (HIGH SENSITIVITY)    EKG EKG Interpretation  Date/Time:  Saturday November 19 2022 04:52:46 EDT Ventricular Rate:  55 PR Interval:  148 QRS Duration: 94 QT Interval:  443 QTC Calculation: 424 R Axis:   96 Text Interpretation: Sinus rhythm Right axis deviation Interpretation limited secondary to artifact Confirmed by Ripley Fraise [phone]) on 11/19/2022 4:54:43 AM  Radiology CT ABDOMEN PELVIS W CONTRAST  Result Date: 11/19/2022 CLINICAL DATA:  Abdominal pain. EXAM: CT ABDOMEN AND PELVIS WITH CONTRAST TECHNIQUE: Multidetector CT imaging of the abdomen and pelvis was performed using the standard protocol following bolus administration of intravenous contrast. RADIATION DOSE REDUCTION: This exam was performed according to the departmental dose-optimization program which includes automated exposure control, adjustment of the mA and/or kV according to patient size and/or use of iterative reconstruction technique. CONTRAST:  176mL OMNIPAQUE IOHEXOL 300 MG/ML  SOLN COMPARISON:  December 14, 2021 FINDINGS: Lower chest: No acute abnormality. Hepatobiliary: There is diffuse fatty infiltration of the liver parenchyma. Numerous large, stable simple hepatic cysts are seen within both the right and left lobes of the liver. No gallstones, gallbladder wall thickening,  or biliary dilatation. Pancreas: Unremarkable. No pancreatic ductal dilatation or surrounding inflammatory changes. Spleen: Normal in size without focal abnormality. Adrenals/Urinary Tract: Adrenal glands are  unremarkable. Kidneys are normal in size, without renal calculi or hydronephrosis. Several stable bilateral simple renal cysts are seen. Bladder is unremarkable. Stomach/Bowel: Stomach is within normal limits. The appendix is not visualized. No evidence of bowel wall thickening, distention, or inflammatory changes. Vascular/Lymphatic: Aortic atherosclerosis. No enlarged abdominal or pelvic lymph nodes. Reproductive: The prostate gland is mildly enlarged and unchanged in size. Other: No abdominal wall hernia or abnormality. No abdominopelvic ascites. Musculoskeletal: No acute or significant osseous findings. IMPRESSION: 1. Hepatic steatosis. 2. Multiple large, stable simple hepatic and renal cysts 3. Mild, stable prostatomegaly. 4. Aortic atherosclerosis. Aortic Atherosclerosis (ICD10-I70.0). Electronically Signed   By: Virgina Norfolk M.D.   On: 11/19/2022 05:24   DG Chest Portable 1 View  Result Date: 11/19/2022 CLINICAL DATA:  Chest pain, weakness EXAM: PORTABLE CHEST 1 VIEW COMPARISON:  01/05/2017 FINDINGS: Lungs are clear. No pneumothorax or pleural effusion. 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 Cardiac size within normal limits. Pulmonary vascularity is normal. No acute bone abnormality. IMPRESSION: 1. No active disease. 2. 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 If indicated, this would be better assessed with CT arteriography. Electronically Signed   By: Fidela Salisbury M.D.   On: 11/19/2022 04:10    Procedures Procedures    Medications Ordered in ED Medications  ondansetron (ZOFRAN) injection 4 mg (4 mg Intravenous Given 11/19/22 0356)  sodium chloride 0.9 % bolus 1,000 mL (1,000 mLs Intravenous Bolus 11/19/22 0351)  iohexol (OMNIPAQUE) 300 MG/ML solution 100 mL (100 mLs Intravenous Contrast Given 11/19/22 0507)    ED Course/ Medical Decision Making/ A&P Clinical Course as of 11/19/22 0647  Sat Nov 19, 2022  0445 Patient resting comfortably, but  reports continued abdominal discomfort.  Patient denies any chest pain, denies any ripping or tearing sensation to his back.  He has equal pulses x 4.  However he does have focal abdominal tenderness.  Due to focal tenderness, patient will proceed with CT imaging [DW]  0645 Overall patient feels much improved.  He is in no acute distress.  He is now drinking fluids. CT scan is largely unremarkable, he has known simple cyst that are still present. Patient feels comfortable for discharge home.  His heart rate has been persistently in the 50s with a sinus rhythm.  No signs of AV block.  He had no further chest pain.  He is awake and alert in no acute distress.  Reports he had these episodes about 3 times a year.  Plan will be to discharge home.  Will refer to a PCP and GI.  Will also start him on a PPI [DW]    Clinical Course User Index [DW] Ripley Fraise, MD                             Medical Decision Making Amount and/or Complexity of Data Reviewed Labs: ordered. Radiology: ordered. ECG/medicine tests: ordered.  Risk Prescription drug management.   This patient presents to the ED for concern of abdominal pain, this involves an extensive number of treatment options, and is a complaint that carries with it a high risk of complications and morbidity.  The differential diagnosis includes but is not limited to cholecystitis, cholelithiasis, pancreatitis, gastritis, peptic ulcer disease, appendicitis, bowel obstruction, bowel perforation, diverticulitis, AAA, ischemic bowel, acute coronary  syndrome    Social Determinants of Health: Patient's impaired access to primary care  increases the complexity of managing their presentation  Additional history obtained: Records reviewed previous admission documents  Lab Tests: I Ordered, and personally interpreted labs.  The pertinent results include: Labs overall unremarkable  Imaging Studies ordered: I ordered imaging studies including CT scan  abdomen pelvis   I independently visualized and interpreted imaging which showed no acute findings I agree with the radiologist interpretation  Cardiac Monitoring: The patient was maintained on a cardiac monitor.  I personally viewed and interpreted the cardiac monitor which showed an underlying rhythm of:  sinus bradycardia  Medicines ordered and prescription drug management: I ordered medication including Zofran for nausea Reevaluation of the patient after these medicines showed that the patient    improved   Reevaluation: After the interventions noted above, I reevaluated the patient and found that they have :improved  Complexity of problems addressed: Patient's presentation is most consistent with  acute presentation with potential threat to life or bodily function  Disposition: After consideration of the diagnostic results and the patient's response to treatment,  I feel that the patent would benefit from discharge   .           Final Clinical Impression(s) / ED Diagnoses Final diagnoses:  Epigastric pain  Nausea and vomiting, unspecified vomiting type    Rx / DC Orders ED Discharge Orders          Ordered    pantoprazole (PROTONIX) 40 MG tablet  Daily        11/19/22 0647              Ripley Fraise, MD 11/19/22 [phone]

## 2022-11-19 NOTE — ED Triage Notes (Signed)
Left sided abdominal pain with nausea, no vomiting. Pt cannot stay awake for EMS no longer than 13mins. Pt HR was bradycardic enroute.

## 2022-11-19 NOTE — ED Triage Notes (Signed)
HR 46, BP 146/90, 98 RA, CBG 130. HX of gastroenteritis

## 2023-02-10 ENCOUNTER — Encounter (HOSPITAL_COMMUNITY): Payer: Self-pay

## 2023-02-10 ENCOUNTER — Emergency Department (HOSPITAL_COMMUNITY)
Admission: EM | Admit: 2023-02-10 | Discharge: 2023-02-10 | Disposition: A | Discharge status: Home / Self Care | Payer: Medicare HMO | Attending: Emergency Medicine | Admitting: Emergency Medicine

## 2023-02-10 ENCOUNTER — Other Ambulatory Visit: Payer: Self-pay

## 2023-02-10 DIAGNOSIS — R109 Unspecified abdominal pain: Secondary | ICD-10-CM | POA: Diagnosis present

## 2023-02-10 DIAGNOSIS — Z79899 Other long term (current) drug therapy: Secondary | ICD-10-CM | POA: Insufficient documentation

## 2023-02-10 DIAGNOSIS — R112 Nausea with vomiting, unspecified: Secondary | ICD-10-CM

## 2023-02-10 DIAGNOSIS — F12188 Cannabis abuse with other cannabis-induced disorder: Secondary | ICD-10-CM | POA: Diagnosis not present

## 2023-02-10 LAB — CBC
HCT: 40.3 % (ref 39.0–52.0)
Hemoglobin: 13 g/dL (ref 13.0–17.0)
MCH: 29.5 pg (ref 26.0–34.0)
MCHC: 32.3 g/dL (ref 30.0–36.0)
MCV: 91.4 fL (ref 80.0–100.0)
Platelets: 232 10*3/uL (ref 150–400)
RBC: 4.41 MIL/uL (ref 4.22–5.81)
RDW: 14.3 % (ref 11.5–15.5)
WBC: 4.9 10*3/uL (ref 4.0–10.5)
nRBC: 0 % (ref 0.0–0.2)

## 2023-02-10 LAB — COMPREHENSIVE METABOLIC PANEL
ALT: 15 U/L (ref 0–44)
AST: 23 U/L (ref 15–41)
Albumin: 3.8 g/dL (ref 3.5–5.0)
Alkaline Phosphatase: 57 U/L (ref 38–126)
Anion gap: 10 (ref 5–15)
BUN: 9 mg/dL (ref 8–23)
CO2: 22 mmol/L (ref 22–32)
Calcium: 8.9 mg/dL (ref 8.9–10.3)
Chloride: 108 mmol/L (ref 98–111)
Creatinine, Ser: 0.87 mg/dL (ref 0.61–1.24)
GFR, Estimated: >60 mL/min (ref >60)
Glucose, Bld: 149 mg/dL — ABNORMAL HIGH (ref 70–99)
Potassium: 4 mmol/L (ref 3.5–5.1)
Sodium: 140 mmol/L (ref 135–145)
Total Bilirubin: 1.3 mg/dL — ABNORMAL HIGH (ref 0.3–1.2)
Total Protein: 6.4 g/dL — ABNORMAL LOW (ref 6.5–8.1)

## 2023-02-10 LAB — CBG MONITORING, ED: Glucose-Capillary: 143 mg/dL — ABNORMAL HIGH (ref 70–99)

## 2023-02-10 LAB — RAPID URINE DRUG SCREEN, HOSP PERFORMED
Amphetamines: NOT DETECTED
Barbiturates: NOT DETECTED
Benzodiazepines: NOT DETECTED
Cocaine: NOT DETECTED
Opiates: NOT DETECTED
Tetrahydrocannabinol: POSITIVE — AB

## 2023-02-10 MED ORDER — ONDANSETRON HCL 4 MG/2ML IJ SOLN
INTRAMUSCULAR | Status: AC
  Administered 2023-02-10: 8 mg
  Filled 2023-02-10: qty 2

## 2023-02-10 MED ORDER — SODIUM CHLORIDE 0.9 % IV SOLN
8.0000 mg | Freq: Once | INTRAVENOUS | Status: DC
  Filled 2023-02-10: qty 4

## 2023-02-10 MED ORDER — ONDANSETRON HCL 4 MG PO TABS: 4.0000 mg | ORAL_TABLET | Freq: Three times a day (TID) | ORAL | 0 refills | Status: AC | PRN

## 2023-02-10 MED ORDER — SODIUM CHLORIDE 0.9 % IV SOLN: Freq: Once | INTRAVENOUS | Status: AC

## 2023-02-10 NOTE — Discharge Instructions (Addendum)
As we discussed, you abdominal pain, nausea, and vomiting are most likely a result of marijuana use. Your blood work is unremarkable and does not indicate towards a disease process or infection as the cause of your symptoms. A prescription for Zofran has been given to take every 8 hours as needed for nausea and vomiting. Let the tablet dissolve in your mouth. Hot showers can also help to relieve your symptoms. Drink plenty of fluids with electrolytes like gatorade, liquid IV, or pedialyte when you have excessive vomiting to avoid electrolyte imbalances. Avoid marijuana use.  Get help right away if: You have pain in your chest, neck, arm, or jaw. You feel very weak or you faint. You vomit again and again. You have vomit that is bright red or looks like black coffee grounds. You have bloody or black poop (stools) or poop that looks like tar. You have a very bad headache, a stiff neck, or both. You have trouble breathing.

## 2023-02-10 NOTE — ED Triage Notes (Signed)
[place] via ems with a CC of nausea/ vomiting. EMS relays pt had a HR in the low 42 while en route. Pt relays feeling sick the last couple of days with dizziness. Pt denies cp, sob, loc.

## 2023-02-10 NOTE — ED Provider Notes (Signed)
[place] EMERGENCY DEPARTMENT AT [facility] Provider Note   CSN: [id number] Arrival date & time: 02/10/23  1135     History  Chief Complaint  Patient presents with   Bradycardia   Nausea    Thomas Hinton is a 67 y.o. male with no significant past medical history who presents to the ED via EMS today for abdominal pain. Patient reports that he has been experiencing generalized abdominal pain since he woke up this morning as well as 5 episodes of vomiting. He reports numerous episodes of this for several years now. He has never seen a gastroenterologist for this before. He denies fever, dysuria, or changes in bowel habits. He reports regular marijuana use with the last time being last night.    Home Medications Prior to Admission medications   Medication Sig Start Date End Date Taking? Authorizing Provider  ondansetron (ZOFRAN) 4 MG tablet Take 1 tablet (4 mg total) by mouth every 8 (eight) hours as needed for up to 14 days for nausea or vomiting. 02/10/23 02/24/23 Yes Maxwell Marion, PA-C  pantoprazole (PROTONIX) 40 MG tablet Take 1 tablet (40 mg total) by mouth daily. 11/19/22   Zadie Rhine, MD      Allergies    Penicillins    Review of Systems   Review of Systems  Gastrointestinal:  Positive for abdominal pain, nausea and vomiting.  All other systems reviewed and are negative.   Physical Exam Updated Vital Signs BP 126/84 (BP Location: Right Arm)   Pulse (!) 58   Temp 98.5 F (36.9 C) (Oral)   Resp 16   Ht 6\' 1"  (1.854 m)   Wt 98 kg   SpO2 100%   BMI 28.50 kg/m  Physical Exam Vitals and nursing note reviewed.  Constitutional:      Appearance: Normal appearance. He is ill-appearing.  HENT:     Head: Normocephalic and atraumatic.     Mouth/Throat:     Mouth: Mucous membranes are moist.  Eyes:     Conjunctiva/sclera: Conjunctivae normal.     Pupils: Pupils are equal, round, and reactive to light.  Cardiovascular:     Rate and Rhythm: Normal rate  and regular rhythm.     Pulses: Normal pulses.  Pulmonary:     Effort: Pulmonary effort is normal.     Breath sounds: Normal breath sounds.  Abdominal:     Palpations: Abdomen is soft.     Tenderness: There is abdominal tenderness.     Comments: Generalized abdominal tenderness  Skin:    General: Skin is warm and dry.     Findings: No rash.  Neurological:     General: No focal deficit present.     Mental Status: He is alert.  Psychiatric:        Mood and Affect: Mood normal.        Behavior: Behavior normal.     ED Results / Procedures / Treatments   Labs (all labs ordered are listed, but only abnormal results are displayed) Labs Reviewed  COMPREHENSIVE METABOLIC PANEL - Abnormal; Notable for the following components:      Result Value   Glucose, Bld 149 (*)    Total Protein 6.4 (*)    Total Bilirubin 1.3 (*)    All other components within normal limits  RAPID URINE DRUG SCREEN, HOSP PERFORMED - Abnormal; Notable for the following components:   Tetrahydrocannabinol POSITIVE (*)    All other components within normal limits  CBG MONITORING, ED - Abnormal;  Notable for the following components:   Glucose-Capillary 143 (*)    All other components within normal limits  CBC    EKG EKG Interpretation  Date/Time:  Friday February 10 2023 11:43:58 EDT Ventricular Rate:  79 PR Interval:  155 QRS Duration: 96 QT Interval:  403 QTC Calculation: 462 R Axis:   76 Text Interpretation: Sinus rhythm ST elevation suggests acute pericarditis Since last tracing rate faster Confirmed by Eber Hong ([id number]) on 02/10/2023 11:57:14 AM  Radiology No results found.  Procedures Procedures: not indicated.    Medications Ordered in ED Medications  0.9 %  sodium chloride infusion (0 mLs Intravenous Stopped 02/10/23 1429)  ondansetron (ZOFRAN) 4 MG/2ML injection (8 mg  Given 02/10/23 1207)    ED Course/ Medical Decision Making/ A&P                             Medical Decision  Making Amount and/or Complexity of Data Reviewed Labs: ordered.  Risk Prescription drug management.   This patient presents to the ED for concern of abdominal pain with N/V, this involves an extensive number of treatment options, and is a complaint that carries with it a high risk of complications and morbidity.   Differential diagnosis includes: gastritis, gastroenteritis, PUD, dehydration, cannabinoid hyperemesis syndrome.   Co morbidities that complicate the patient evaluation  Frequent marijuana use   Cardiac Monitoring / EKG:  The patient was maintained on a cardiac monitor.  I personally viewed and interpreted the cardiac monitored which showed: sinus rhythm with a heart rate of 79 bpm.   Lab Tests:  I ordered and personally interpreted labs - positive for marijuana otherwise within normal limits. No acute electrolyte abnormalities or AKI.   Imaging Studies:  Not ordered due to chronic condition. Imaging was ordered several months ago for the same complaint and it was unremarkable.   Problem List / ED Course / Critical interventions / Medication management  Abdominal pain, N/V EMS reported that patient was bradycardic in transit to ED with a heart rate of 42 bpm. Most likely second to vomiting/dehydration. Heart rate 75 bpm in the ED. I ordered medications including:  Zofran and fluids for nausea and hydration  Reevaluation of the patient after these medicines showed that the patient improved I have reviewed the patients home medicines and have made adjustments as needed   Social Determinants of Health:  No primary care provider   Test / Admission - Considered:  Patient stable and safe for discharge home.         Final Clinical Impression(s) / ED Diagnoses Final diagnoses:  Cannabinoid hyperemesis syndrome    Rx / DC Orders ED Discharge Orders          Ordered    ondansetron (ZOFRAN) 4 MG tablet  Every 8 hours PRN        02/10/23 1423               Maxwell Marion, PA-C 02/10/23 1429    Eber Hong, MD 02/11/23 [phone]

## 2023-06-30 ENCOUNTER — Emergency Department (HOSPITAL_COMMUNITY): Payer: Medicare HMO

## 2023-06-30 ENCOUNTER — Encounter (HOSPITAL_COMMUNITY): Payer: Self-pay | Admitting: Emergency Medicine

## 2023-06-30 ENCOUNTER — Other Ambulatory Visit: Payer: Self-pay

## 2023-06-30 ENCOUNTER — Encounter (HOSPITAL_COMMUNITY): Payer: Medicaid Other

## 2023-06-30 ENCOUNTER — Emergency Department (HOSPITAL_BASED_OUTPATIENT_CLINIC_OR_DEPARTMENT_OTHER)
Admit: 2023-06-30 | Discharge: 2023-06-30 | Disposition: A | Discharge status: Home / Self Care | Payer: Medicare HMO | Attending: Emergency Medicine | Admitting: Emergency Medicine

## 2023-06-30 ENCOUNTER — Emergency Department (HOSPITAL_COMMUNITY)
Admission: EM | Admit: 2023-06-30 | Discharge: 2023-06-30 | Disposition: A | Discharge status: Home / Self Care | Payer: Medicare HMO | Attending: Emergency Medicine | Admitting: Emergency Medicine

## 2023-06-30 DIAGNOSIS — M79662 Pain in left lower leg: Secondary | ICD-10-CM | POA: Diagnosis not present

## 2023-06-30 DIAGNOSIS — R001 Bradycardia, unspecified: Secondary | ICD-10-CM | POA: Diagnosis not present

## 2023-06-30 DIAGNOSIS — K760 Fatty (change of) liver, not elsewhere classified: Secondary | ICD-10-CM | POA: Insufficient documentation

## 2023-06-30 DIAGNOSIS — R1011 Right upper quadrant pain: Secondary | ICD-10-CM | POA: Diagnosis not present

## 2023-06-30 DIAGNOSIS — R531 Weakness: Secondary | ICD-10-CM | POA: Diagnosis not present

## 2023-06-30 DIAGNOSIS — Z743 Need for continuous supervision: Secondary | ICD-10-CM | POA: Diagnosis not present

## 2023-06-30 DIAGNOSIS — R1010 Upper abdominal pain, unspecified: Secondary | ICD-10-CM

## 2023-06-30 DIAGNOSIS — R1111 Vomiting without nausea: Secondary | ICD-10-CM | POA: Diagnosis not present

## 2023-06-30 LAB — CBC WITH DIFFERENTIAL/PLATELET
Abs Immature Granulocytes: 0.02 10*3/uL (ref 0.00–0.07)
Basophils Absolute: 0 10*3/uL (ref 0.0–0.1)
Basophils Relative: 0 %
Eosinophils Absolute: 0 10*3/uL (ref 0.0–0.5)
Eosinophils Relative: 0 %
HCT: 44.9 % (ref 39.0–52.0)
Hemoglobin: 15.5 g/dL (ref 13.0–17.0)
Immature Granulocytes: 0 %
Lymphocytes Relative: 15 %
Lymphs Abs: 0.8 10*3/uL (ref 0.7–4.0)
MCH: 30 pg (ref 26.0–34.0)
MCHC: 34.5 g/dL (ref 30.0–36.0)
MCV: 86.8 fL (ref 80.0–100.0)
Monocytes Absolute: 0.3 10*3/uL (ref 0.1–1.0)
Monocytes Relative: 5 %
Neutro Abs: 4.2 10*3/uL (ref 1.7–7.7)
Neutrophils Relative %: 80 %
Platelets: 229 10*3/uL (ref 150–400)
RBC: 5.17 MIL/uL (ref 4.22–5.81)
RDW: 13.3 % (ref 11.5–15.5)
WBC: 5.2 10*3/uL (ref 4.0–10.5)
nRBC: 0 % (ref 0.0–0.2)

## 2023-06-30 LAB — LIPASE, BLOOD: Lipase: 42 U/L (ref 11–51)

## 2023-06-30 LAB — COMPREHENSIVE METABOLIC PANEL
ALT: 23 U/L (ref 0–44)
AST: 33 U/L (ref 15–41)
Albumin: 4.7 g/dL (ref 3.5–5.0)
Alkaline Phosphatase: 70 U/L (ref 38–126)
Anion gap: 11 (ref 5–15)
BUN: 18 mg/dL (ref 8–23)
CO2: 27 mmol/L (ref 22–32)
Calcium: 9.3 mg/dL (ref 8.9–10.3)
Chloride: 101 mmol/L (ref 98–111)
Creatinine, Ser: 0.83 mg/dL (ref 0.61–1.24)
GFR, Estimated: >60 mL/min (ref >60)
Glucose, Bld: 134 mg/dL — ABNORMAL HIGH (ref 70–99)
Potassium: 3.8 mmol/L (ref 3.5–5.1)
Sodium: 139 mmol/L (ref 135–145)
Total Bilirubin: 1.9 mg/dL — ABNORMAL HIGH (ref 0.3–1.2)
Total Protein: 7.7 g/dL (ref 6.5–8.1)

## 2023-06-30 LAB — D-DIMER, QUANTITATIVE: D-Dimer, Quant: 0.49 ug{FEU}/mL (ref 0.00–0.50)

## 2023-06-30 LAB — TROPONIN I (HIGH SENSITIVITY): Troponin I (High Sensitivity): 3 ng/L (ref <18)

## 2023-06-30 LAB — ETHANOL: Alcohol, Ethyl (B): <10 mg/dL (ref <10)

## 2023-06-30 MED ORDER — ALUM & MAG HYDROXIDE-SIMETH 200-200-20 MG/5ML PO SUSP
30.0000 mL | Freq: Once | ORAL | Status: AC
  Administered 2023-06-30: 30 mL via ORAL
  Filled 2023-06-30: qty 30

## 2023-06-30 MED ORDER — ONDANSETRON HCL 4 MG/2ML IJ SOLN
4.0000 mg | Freq: Once | INTRAMUSCULAR | Status: AC
  Administered 2023-06-30: 4 mg via INTRAVENOUS
  Filled 2023-06-30: qty 2

## 2023-06-30 MED ORDER — PANTOPRAZOLE SODIUM 40 MG PO TBEC: 40.0000 mg | DELAYED_RELEASE_TABLET | Freq: Every day | ORAL | 0 refills | Status: AC

## 2023-06-30 MED ORDER — SUCRALFATE 1 G PO TABS: 1.0000 g | ORAL_TABLET | Freq: Three times a day (TID) | ORAL | 0 refills | Status: AC

## 2023-06-30 MED ORDER — MORPHINE SULFATE (PF) 4 MG/ML IV SOLN
4.0000 mg | Freq: Once | INTRAVENOUS | Status: AC
  Administered 2023-06-30: 4 mg via INTRAVENOUS
  Filled 2023-06-30: qty 1

## 2023-06-30 MED ORDER — ONDANSETRON 4 MG PO TBDP: 4.0000 mg | ORAL_TABLET | Freq: Three times a day (TID) | ORAL | 0 refills | Status: AC | PRN

## 2023-06-30 NOTE — ED Triage Notes (Addendum)
Pt BIB EMS from home, c/o heartburn and chest wall discomfort. Pt endorsed throwing up/coughing up blood. Nausea/vomit x 2 days. Unable to keep any solid foods down.   BP 148/90 P 52 CBG 119

## 2023-06-30 NOTE — Discharge Instructions (Addendum)
We are prescribing you antiacid medicine as well as nausea medicine to help with your symptoms.  It is important to follow-up with a primary care physician for general health maintenance but also to follow-up your liver.  If you develop worsening, continued, or recurrent abdominal pain, uncontrolled vomiting, fever, chest or back pain, or any other new/concerning symptoms then return to the ER for evaluation.

## 2023-06-30 NOTE — Progress Notes (Signed)
Left lower extremity venous duplex has been completed. Preliminary results can be found in CV Proc through chart review.  Results were given to Dr. Criss Alvine.  06/30/23 12:11 PM Olen Cordial RVT

## 2023-06-30 NOTE — ED Provider Notes (Signed)
[place] EMERGENCY DEPARTMENT AT [facility] Provider Note   CSN: [id number] Arrival date & time: 06/30/23  0930     History  Chief Complaint  Patient presents with   Heartburn    Thomas Hinton is a 66 y.o. male.  HPI 67 year old male presents with vomiting and abdominal pain.  He states that he thinks he started having vomiting 2 days ago after eating some chicken.  He has not had any hematemesis or diarrhea.  He is having upper abdominal pain, primarily in the right upper quadrant.  He states that when he burps he will get some chest burning.  He has been short of breath for about a week and a couple days ago coughed up some blood.  He thinks his left thigh has been painful and may be swollen for about a week as well.  He does not know whether or not he has had a blood clot before.  Home Medications Prior to Admission medications   Medication Sig Start Date End Date Taking? Authorizing Provider  ondansetron (ZOFRAN-ODT) 4 MG disintegrating tablet Take 1 tablet (4 mg total) by mouth every 8 (eight) hours as needed for nausea or vomiting. 06/30/23  Yes Pricilla Loveless, MD  sucralfate (CARAFATE) 1 g tablet Take 1 tablet (1 g total) by mouth 4 (four) times daily -  with meals and at bedtime. 06/30/23  Yes Pricilla Loveless, MD  pantoprazole (PROTONIX) 40 MG tablet Take 1 tablet (40 mg total) by mouth daily. 06/30/23   Pricilla Loveless, MD      Allergies    Penicillins    Review of Systems   Review of Systems  Respiratory:  Positive for cough and shortness of breath.   Cardiovascular:  Positive for chest pain.  Gastrointestinal:  Positive for abdominal pain, nausea and vomiting.    Physical Exam Updated Vital Signs BP 109/79 (BP Location: Right Arm)   Pulse (!) 51   Temp 98.8 F (37.1 C) (Oral)   Resp 17   Ht 5\' 11"  (1.803 m)   Wt 72.6 kg   SpO2 96%   BMI 22.32 kg/m  Physical Exam Vitals and nursing note reviewed.  Constitutional:      General: He is not  in acute distress.    Appearance: He is well-developed. He is not ill-appearing or diaphoretic.     Comments: Intermittent spitting up/vomiting  HENT:     Head: Normocephalic and atraumatic.  Cardiovascular:     Rate and Rhythm: Normal rate and regular rhythm.     Heart sounds: Normal heart sounds.  Pulmonary:     Effort: Pulmonary effort is normal.     Breath sounds: Normal breath sounds.  Abdominal:     Palpations: Abdomen is soft.     Tenderness: There is abdominal tenderness in the right upper quadrant.  Musculoskeletal:     Comments: No thigh/calf tenderness on left. No appreciable swelling.  Skin:    General: Skin is warm and dry.  Neurological:     Mental Status: He is alert.     ED Results / Procedures / Treatments   Labs (all labs ordered are listed, but only abnormal results are displayed) Labs Reviewed  COMPREHENSIVE METABOLIC PANEL - Abnormal; Notable for the following components:      Result Value   Glucose, Bld 134 (*)    Total Bilirubin 1.9 (*)    All other components within normal limits  ETHANOL  LIPASE, BLOOD  CBC WITH DIFFERENTIAL/PLATELET  D-DIMER, QUANTITATIVE  TROPONIN I (HIGH SENSITIVITY)    EKG EKG Interpretation Date/Time:  Friday June 30 2023 09:43:08 EDT Ventricular Rate:  53 PR Interval:  124 QRS Duration:  91 QT Interval:  431 QTC Calculation: 405 R Axis:   57  Text Interpretation: Sinus rhythm Right atrial enlargement  ST elevations similar to June 2024 Confirmed by Pricilla Loveless [phone]) on 06/30/2023 10:00:05 AM  Radiology US Abdomen Limited RUQ (LIVER/GB)  Result Date: 06/30/2023 CLINICAL DATA:  Right upper quadrant pain EXAM: ULTRASOUND ABDOMEN LIMITED RIGHT UPPER QUADRANT COMPARISON:  No prior ultrasound of the abdomen available, correlation is made with 11/19/2022 CT abdomen pelvis FINDINGS: Gallbladder: No gallstones or wall thickening visualized. No sonographic Murphy sign noted by sonographer. No pericholecystic fluid.  Common bile duct: Diameter: 4 mm, within normal limits. No intrahepatic biliary ductal dilatation. Liver: Redemonstrated cysts in both lobes of the liver, the largest of which measures up to 5.4 x 4.4 x 5.6 cm, in the right hepatic lobe; these have been previously described on multiple CT reports. Increased parenchymal echogenicity. Portal vein is patent on color Doppler imaging with normal direction of blood flow towards the liver. Other: None. IMPRESSION: 1. No sonographic evidence of cholecystitis. 2. Hepatic steatosis. Electronically Signed   By: Wiliam Ke M.D.   On: 06/30/2023 13:30   VAS [place] LOWER EXTREMITY VENOUS (DVT) (7a-7p)  Result Date: 06/30/2023  Lower Venous DVT Study Patient Name:  Thomas Hinton  Date of Exam:   06/30/2023 Medical Rec #: [id number]       Accession #:    [id number] Date of Birth: 1956/04/22       Patient Gender: M Patient Age:   50 years Exam Location:  [facility] Procedure:      VAS [place] LOWER EXTREMITY VENOUS (DVT) Referring Phys: Lorin Picket Chael Urenda --------------------------------------------------------------------------------  Indications: Pain.  Risk Factors: None identified. Limitations: Poor ultrasound/tissue interface, body habitus and patient positioning. Comparison Study: No prior studies. Performing Technologist: Chanda Busing RVT  Examination Guidelines: A complete evaluation includes B-mode imaging, spectral Doppler, color Doppler, and power Doppler as needed of all accessible portions of each vessel. Bilateral testing is considered an integral part of a complete examination. Limited examinations for reoccurring indications may be performed as noted. The reflux portion of the exam is performed with the patient in reverse Trendelenburg.  +-----+---------------+---------+-----------+----------+--------------+ RIGHTCompressibilityPhasicitySpontaneityPropertiesThrombus Aging +-----+---------------+---------+-----------+----------+--------------+ CFV   Full           Yes      Yes                                 +-----+---------------+---------+-----------+----------+--------------+   +---------+---------------+---------+-----------+----------+--------------+ LEFT     CompressibilityPhasicitySpontaneityPropertiesThrombus Aging +---------+---------------+---------+-----------+----------+--------------+ CFV      Full           Yes      Yes                                 +---------+---------------+---------+-----------+----------+--------------+ SFJ      Full                                                        +---------+---------------+---------+-----------+----------+--------------+ FV Prox  Full                                                        +---------+---------------+---------+-----------+----------+--------------+  FV Mid   Full                                                        +---------+---------------+---------+-----------+----------+--------------+ FV DistalFull                                                        +---------+---------------+---------+-----------+----------+--------------+ PFV      Full                                                        +---------+---------------+---------+-----------+----------+--------------+ POP      Full           Yes      Yes                                 +---------+---------------+---------+-----------+----------+--------------+ PTV      Full                                                        +---------+---------------+---------+-----------+----------+--------------+ PERO     Full                                                        +---------+---------------+---------+-----------+----------+--------------+    Summary: RIGHT: - No evidence of common femoral vein obstruction.   LEFT: - There is no evidence of deep vein thrombosis in the lower extremity.  - No cystic structure found in the popliteal fossa.  *See  table(s) above for measurements and observations.    Preliminary    DG Chest 2 View  Result Date: 06/30/2023 CLINICAL DATA:  hemoptysis EXAM: CHEST - 2 VIEW COMPARISON:  11/19/2022. FINDINGS: Bilateral lung fields are clear. Bilateral costophrenic angles are clear. Normal cardio-mediastinal silhouette. No acute osseous abnormalities. The soft tissues are within normal limits. IMPRESSION: No active cardiopulmonary disease. Electronically Signed   By: Jules Schick M.D.   On: 06/30/2023 11:11    Procedures Procedures    Medications Ordered in ED Medications  ondansetron (ZOFRAN) injection 4 mg (4 mg Intravenous Given 06/30/23 1021)  morphine (PF) 4 MG/ML injection 4 mg (4 mg Intravenous Given 06/30/23 1021)  alum & mag hydroxide-simeth (MAALOX/MYLANTA) 200-200-20 MG/5ML suspension 30 mL (30 mLs Oral Given 06/30/23 1203)    ED Course/ Medical Decision Making/ A&P                                 Medical Decision Making Amount and/or Complexity of Data Reviewed Labs: ordered.    Details: Troponin normal, LFTs unremarkable besides  mild bilirubin elevation that is chronic. Negative Ddimer.  Radiology: ordered and independent interpretation performed.    Details: Hepatic cysts.  No pneumonia. ECG/medicine tests: ordered and independent interpretation performed.    Details: No ischemia.  Risk OTC drugs. Prescription drug management.   Patient presents with recurrent abdominal pain.  Per chart review and patient history this has been a recurrent issue for him.  After dose of pain medicine and nausea medicine he is feeling a lot better.  He was able to tolerate oral medicine.  Was given a GI cocktail.  I suspect this is all coming from gastritis as he has been diagnosed with this in the past.  He is had previous CTs, including March 2024 that did not show any acute emergent condition.  He has hepatic steatosis and hepatic cysts that are all essentially benign and unchanged compared to that  with the ultrasound today.  LFTs unremarkable compared to baseline.  I highly doubt that this is ACS and his chest burning sounds more like reflux and has been ongoing for a few days.  Negative troponin.  No DVT on ultrasound and a negative D-dimer.  While he did cough up blood 1 time, he tells me there was actually only streaks of blood and he has not had any since.  I doubt PE.  No indication of a mass in his lungs.  At this point, he is asymptomatic and appears stable for discharge home.  Stressed the importance of following up with a PCP.        Final Clinical Impression(s) / ED Diagnoses Final diagnoses:  Upper abdominal pain  Hepatic steatosis    Rx / DC Orders ED Discharge Orders          Ordered    pantoprazole (PROTONIX) 40 MG tablet  Daily        06/30/23 1346    ondansetron (ZOFRAN-ODT) 4 MG disintegrating tablet  Every 8 hours PRN        06/30/23 1346    sucralfate (CARAFATE) 1 g tablet  3 times daily with meals & bedtime        06/30/23 1346              Pricilla Loveless, MD 06/30/23 1408

## 2024-02-13 ENCOUNTER — Emergency Department (HOSPITAL_COMMUNITY)

## 2024-02-13 ENCOUNTER — Emergency Department (HOSPITAL_COMMUNITY)
Admission: EM | Admit: 2024-02-13 | Discharge: 2024-02-13 | Disposition: A | Discharge status: Home / Self Care | Attending: Emergency Medicine | Admitting: Emergency Medicine

## 2024-02-13 ENCOUNTER — Encounter (HOSPITAL_COMMUNITY): Payer: Self-pay

## 2024-02-13 ENCOUNTER — Other Ambulatory Visit: Payer: Self-pay

## 2024-02-13 DIAGNOSIS — S52611A Displaced fracture of right ulna styloid process, initial encounter for closed fracture: Secondary | ICD-10-CM | POA: Diagnosis not present

## 2024-02-13 DIAGNOSIS — S52501A Unspecified fracture of the lower end of right radius, initial encounter for closed fracture: Secondary | ICD-10-CM | POA: Insufficient documentation

## 2024-02-13 DIAGNOSIS — S52614A Nondisplaced fracture of right ulna styloid process, initial encounter for closed fracture: Secondary | ICD-10-CM | POA: Diagnosis not present

## 2024-02-13 DIAGNOSIS — S6991XA Unspecified injury of right wrist, hand and finger(s), initial encounter: Secondary | ICD-10-CM | POA: Diagnosis present

## 2024-02-13 DIAGNOSIS — W1839XA Other fall on same level, initial encounter: Secondary | ICD-10-CM | POA: Diagnosis not present

## 2024-02-13 DIAGNOSIS — M25531 Pain in right wrist: Secondary | ICD-10-CM | POA: Insufficient documentation

## 2024-02-13 MED ORDER — HYDROCODONE-ACETAMINOPHEN 5-325 MG PO TABS
1.0000 | ORAL_TABLET | Freq: Once | ORAL | Status: AC
  Administered 2024-02-13: 1 via ORAL
  Filled 2024-02-13: qty 1

## 2024-02-13 MED ORDER — HYDROCODONE-ACETAMINOPHEN 5-325 MG PO TABS: 1.0000 | ORAL_TABLET | ORAL | 0 refills | Status: AC | PRN

## 2024-02-13 NOTE — ED Provider Notes (Signed)
 [name] EMERGENCY DEPARTMENT AT [facility] Provider Note   CSN: [id number] Arrival date & time: 02/13/24  1215     History  Chief Complaint  Patient presents with   Wrist Pain    Thomas Hinton is a 68 y.o. male.  HPI 69 year old male presents with a right wrist injury.  Last night while bowling he fell and landed on his outstretched hand.  Pain is continued this morning and there is some swelling so he presents to the ED.  He denies any numbness in his hand.  Home Medications Prior to Admission medications   Medication Sig Start Date End Date Taking? Authorizing Provider  HYDROcodone-acetaminophen  (NORCO/VICODIN) 5-325 MG tablet Take 1 tablet by mouth every 4 (four) hours as needed for severe pain (pain score 7-10). 02/13/24  Yes Jerilynn Montenegro, MD  ondansetron  (ZOFRAN -ODT) 4 MG disintegrating tablet Take 1 tablet (4 mg total) by mouth every 8 (eight) hours as needed for nausea or vomiting. 06/30/23   Jerilynn Montenegro, MD  pantoprazole  (PROTONIX ) 40 MG tablet Take 1 tablet (40 mg total) by mouth daily. 06/30/23   Jerilynn Montenegro, MD  sucralfate  (CARAFATE ) 1 g tablet Take 1 tablet (1 g total) by mouth 4 (four) times daily -  with meals and at bedtime. 06/30/23   Jerilynn Montenegro, MD      Allergies    Penicillins    Review of Systems   Review of Systems  Musculoskeletal:  Positive for arthralgias and joint swelling.  Neurological:  Negative for numbness.    Physical Exam Updated Vital Signs BP (!) 156/89 (BP Location: Left Arm)   Pulse (!) 54   Temp 98.6 F (37 C) (Oral)   Resp 16   Ht 5\' 11"  (1.803 m)   Wt 72.6 kg   SpO2 99%   BMI 22.32 kg/m  Physical Exam Vitals and nursing note reviewed.  Constitutional:      Appearance: He is well-developed.  HENT:     Head: Normocephalic and atraumatic.  Cardiovascular:     Rate and Rhythm: Normal rate and regular rhythm.     Pulses:          Radial pulses are 2+ on the right side.  Pulmonary:     Effort:  Pulmonary effort is normal.  Musculoskeletal:     Right forearm: No tenderness.     Right wrist: Swelling and tenderness present. No deformity. Decreased range of motion (mild).     Right hand: No tenderness. Normal range of motion. Normal sensation.  Skin:    General: Skin is warm and dry.  Neurological:     Mental Status: He is alert.     ED Results / Procedures / Treatments   Labs (all labs ordered are listed, but only abnormal results are displayed) Labs Reviewed - No data to display  EKG None  Radiology DG Wrist Complete Right Result Date: 02/13/2024 CLINICAL DATA:  Recent fall, wrist pain EXAM: RIGHT WRIST - COMPLETE 3+ VIEW COMPARISON:  None Available. FINDINGS: There is an acute right distal radius nondisplaced fracture of the metaphysis. Associated ulnar styloid avulsion fracture as well. Diffuse soft tissue swelling. No wrist malalignment. Carpal bones appear intact. IMPRESSION: Acute right distal radius and ulnar styloid fractures as above. Electronically Signed   By: Melven Stable.  Shick M.D.   On: 02/13/2024 13:30    Procedures Procedures    Medications Ordered in ED Medications  HYDROcodone-acetaminophen  (NORCO/VICODIN) 5-325 MG per tablet 1 tablet (1 tablet Oral Given 02/13/24 1322)  ED Course/ Medical Decision Making/ A&P                                 Medical Decision Making Amount and/or Complexity of Data Reviewed Radiology: ordered and independent interpretation performed.    Details: Nondisplaced fractures  Risk Prescription drug management.   Patient was given hydrocodone for pain.  He is neurovascularly intact.  He does appear to have some nondisplaced fractures and was placed in a sugar-tong splint and will refer to orthopedic hand surgery for outpatient follow-up.  Will give a brief course of hydrocodone for breakthrough pain.  No other injuries.  Discharged home with return precautions.        Final Clinical Impression(s) / ED Diagnoses Final  diagnoses:  Closed fracture of distal end of right radius, unspecified fracture morphology, initial encounter  Closed nondisplaced fracture of styloid process of right ulna, initial encounter    Rx / DC Orders ED Discharge Orders          Ordered    HYDROcodone-acetaminophen  (NORCO/VICODIN) 5-325 MG tablet  Every 4 hours PRN        02/13/24 1348              Jerilynn Montenegro, MD 02/13/24 1424

## 2024-02-13 NOTE — ED Triage Notes (Signed)
 Pt fell last night while bowling injuring his right wrist. Pt holding his right arm.

## 2024-02-13 NOTE — Progress Notes (Signed)
 Orthopedic Tech Progress Note Patient Details:  Thomas Hinton Dec 23, 1955 [id number]  Ortho Devices Type of Ortho Device: Ace wrap, Cotton web roll, Sling immobilizer, Sugartong splint Ortho Device/Splint Location: right sugartong applied. right sling applied Ortho Device/Splint Interventions: Ordered, Application, Adjustment   Post Interventions Patient Tolerated: Well Instructions Provided: Adjustment of device, Care of device  Leodis Rainwater 02/13/2024, 2:19 PM

## 2024-02-13 NOTE — Discharge Instructions (Signed)
 Leave the splint on and do not get it wet until you see the orthopedist.  Call their office for an appointment in the next 1 week.  Alternate between ibuprofen and Tylenol  to help with pain.  If this is not helping your pain then you may take the hydrocodone for breakthrough/severe pain.  If you develop new or worsening or intractable pain, numbness in your hand or fingers, or any other new/concerning symptoms then return to the ER for evaluation.

## 2024-04-07 ENCOUNTER — Encounter (HOSPITAL_COMMUNITY): Payer: Self-pay | Admitting: *Deleted

## 2024-04-07 ENCOUNTER — Emergency Department (HOSPITAL_COMMUNITY)
Admission: EM | Admit: 2024-04-07 | Discharge: 2024-04-07 | Disposition: A | Discharge status: Home / Self Care | Attending: Emergency Medicine | Admitting: Emergency Medicine

## 2024-04-07 ENCOUNTER — Other Ambulatory Visit: Payer: Self-pay

## 2024-04-07 ENCOUNTER — Emergency Department (HOSPITAL_COMMUNITY)

## 2024-04-07 DIAGNOSIS — K7689 Other specified diseases of liver: Secondary | ICD-10-CM | POA: Diagnosis not present

## 2024-04-07 DIAGNOSIS — E876 Hypokalemia: Secondary | ICD-10-CM | POA: Diagnosis not present

## 2024-04-07 DIAGNOSIS — E871 Hypo-osmolality and hyponatremia: Secondary | ICD-10-CM | POA: Insufficient documentation

## 2024-04-07 DIAGNOSIS — R112 Nausea with vomiting, unspecified: Secondary | ICD-10-CM | POA: Insufficient documentation

## 2024-04-07 DIAGNOSIS — Z87891 Personal history of nicotine dependence: Secondary | ICD-10-CM | POA: Diagnosis not present

## 2024-04-07 LAB — CBC WITH DIFFERENTIAL/PLATELET
Abs Immature Granulocytes: 0.02 K/uL (ref 0.00–0.07)
Basophils Absolute: 0 K/uL (ref 0.0–0.1)
Basophils Relative: 0 %
Eosinophils Absolute: 0 K/uL (ref 0.0–0.5)
Eosinophils Relative: 0 %
HCT: 42.4 % (ref 39.0–52.0)
Hemoglobin: 14.8 g/dL (ref 13.0–17.0)
Immature Granulocytes: 0 %
Lymphocytes Relative: 23 %
Lymphs Abs: 1.3 K/uL (ref 0.7–4.0)
MCH: 29.8 pg (ref 26.0–34.0)
MCHC: 34.9 g/dL (ref 30.0–36.0)
MCV: 85.5 fL (ref 80.0–100.0)
Monocytes Absolute: 0.7 K/uL (ref 0.1–1.0)
Monocytes Relative: 12 %
Neutro Abs: 3.6 K/uL (ref 1.7–7.7)
Neutrophils Relative %: 65 %
Platelets: 203 K/uL (ref 150–400)
RBC: 4.96 MIL/uL (ref 4.22–5.81)
RDW: 13.8 % (ref 11.5–15.5)
WBC: 5.7 K/uL (ref 4.0–10.5)
nRBC: 0 % (ref 0.0–0.2)

## 2024-04-07 LAB — COMPREHENSIVE METABOLIC PANEL WITH GFR
ALT: 15 U/L (ref 0–44)
AST: 23 U/L (ref 15–41)
Albumin: 3.8 g/dL (ref 3.5–5.0)
Alkaline Phosphatase: 69 U/L (ref 38–126)
Anion gap: 16 — ABNORMAL HIGH (ref 5–15)
BUN: 17 mg/dL (ref 8–23)
CO2: 23 mmol/L (ref 22–32)
Calcium: 8.8 mg/dL — ABNORMAL LOW (ref 8.9–10.3)
Chloride: 95 mmol/L — ABNORMAL LOW (ref 98–111)
Creatinine, Ser: 0.84 mg/dL (ref 0.61–1.24)
GFR, Estimated: >60 mL/min (ref >60)
Glucose, Bld: 89 mg/dL (ref 70–99)
Potassium: 3.3 mmol/L — ABNORMAL LOW (ref 3.5–5.1)
Sodium: 134 mmol/L — ABNORMAL LOW (ref 135–145)
Total Bilirubin: 4.3 mg/dL — ABNORMAL HIGH (ref 0.0–1.2)
Total Protein: 7.1 g/dL (ref 6.5–8.1)

## 2024-04-07 LAB — URINALYSIS, ROUTINE W REFLEX MICROSCOPIC
Bacteria, UA: NONE SEEN
Bilirubin Urine: NEGATIVE
Glucose, UA: NEGATIVE mg/dL
Ketones, ur: 80 mg/dL — AB
Leukocytes,Ua: NEGATIVE
Nitrite: NEGATIVE
Protein, ur: 100 mg/dL — AB
Specific Gravity, Urine: 1.026 (ref 1.005–1.030)
pH: 5 (ref 5.0–8.0)

## 2024-04-07 LAB — RESP PANEL BY RT-PCR (RSV, FLU A&B, COVID)  RVPGX2
Influenza A by PCR: NEGATIVE
Influenza B by PCR: NEGATIVE
Resp Syncytial Virus by PCR: NEGATIVE
SARS Coronavirus 2 by RT PCR: NEGATIVE

## 2024-04-07 LAB — LIPASE, BLOOD: Lipase: 33 U/L (ref 11–51)

## 2024-04-07 LAB — HEPATIC FUNCTION PANEL
ALT: 14 U/L (ref 0–44)
AST: 23 U/L (ref 15–41)
Albumin: 3.9 g/dL (ref 3.5–5.0)
Alkaline Phosphatase: 68 U/L (ref 38–126)
Bilirubin, Direct: 0.5 mg/dL — ABNORMAL HIGH (ref 0.0–0.2)
Indirect Bilirubin: 3.5 mg/dL — ABNORMAL HIGH (ref 0.3–0.9)
Total Bilirubin: 4 mg/dL — ABNORMAL HIGH (ref 0.0–1.2)
Total Protein: 7.1 g/dL (ref 6.5–8.1)

## 2024-04-07 MED ORDER — SODIUM CHLORIDE 0.9 % IV BOLUS
1000.0000 mL | Freq: Once | INTRAVENOUS | Status: AC
  Administered 2024-04-07: 1000 mL via INTRAVENOUS

## 2024-04-07 MED ORDER — ONDANSETRON HCL 4 MG/2ML IJ SOLN
4.0000 mg | Freq: Once | INTRAMUSCULAR | Status: AC
  Administered 2024-04-07: 4 mg via INTRAVENOUS
  Filled 2024-04-07: qty 2

## 2024-04-07 MED ORDER — DICYCLOMINE HCL 10 MG PO CAPS
10.0000 mg | ORAL_CAPSULE | Freq: Once | ORAL | Status: AC
  Administered 2024-04-07: 10 mg via ORAL
  Filled 2024-04-07: qty 1

## 2024-04-07 MED ORDER — PANTOPRAZOLE SODIUM 40 MG IV SOLR
40.0000 mg | Freq: Once | INTRAVENOUS | Status: AC
  Administered 2024-04-07: 40 mg via INTRAVENOUS
  Filled 2024-04-07: qty 10

## 2024-04-07 NOTE — ED Provider Notes (Signed)
 [place] EMERGENCY DEPARTMENT AT [facility] Provider Note   CSN: [id number] Arrival date & time: 04/07/24  8386     Patient presents with: Illness   Thomas Hinton is a 68 y.o. male.   68 year old male presenting with nasal congestion/drainage, nausea/vomiting, generalized abdominal pain.  Symptoms have been ongoing for several days, denies cough/shortness of breath, denies chest pain, denies fever, denies diarrhea/melena/hematochezia.  Reports that vomiting has improved but has not entirely resolved.  No known sick contacts.  Patient endorses marijuana use 3 days ago, states that it makes me feel sick sometimes.   Illness      Prior to Admission medications   Medication Sig Start Date End Date Taking? Authorizing Provider  HYDROcodone -acetaminophen  (NORCO/VICODIN) 5-325 MG tablet Take 1 tablet by mouth every 4 (four) hours as needed for severe pain (pain score 7-10). 02/13/24   Freddi Hamilton, MD  ondansetron  (ZOFRAN -ODT) 4 MG disintegrating tablet Take 1 tablet (4 mg total) by mouth every 8 (eight) hours as needed for nausea or vomiting. 06/30/23   Freddi Hamilton, MD  pantoprazole  (PROTONIX ) 40 MG tablet Take 1 tablet (40 mg total) by mouth daily. 06/30/23   Freddi Hamilton, MD  sucralfate  (CARAFATE ) 1 g tablet Take 1 tablet (1 g total) by mouth 4 (four) times daily -  with meals and at bedtime. 06/30/23   Freddi Hamilton, MD    Allergies: Penicillins    Review of Systems  Updated Vital Signs  Vitals:   04/07/24 1627 04/07/24 1638 04/07/24 1856  BP: (!) 161/100  (!) 162/72  Pulse: (!) 57  (!) 48  Resp: 18  18  Temp: 98.3 F (36.8 C)    TempSrc: Oral    SpO2: 98%  98%  Weight:  72.6 kg      Physical Exam Vitals and nursing note reviewed.  HENT:     Head: Normocephalic.  Eyes:     Extraocular Movements: Extraocular movements intact.  Cardiovascular:     Rate and Rhythm: Regular rhythm. Bradycardia present.  Pulmonary:     Effort: Pulmonary  effort is normal.     Breath sounds: Normal breath sounds.  Abdominal:     Palpations: Abdomen is soft.     Tenderness: There is abdominal tenderness (mild, generalized). There is no guarding.  Musculoskeletal:     Cervical back: Normal range of motion.     Comments: Moves all extremities spontaneously without difficulty  Skin:    General: Skin is warm and dry.  Neurological:     Mental Status: He is alert and oriented to person, place, and time.     (all labs ordered are listed, but only abnormal results are displayed) Labs Reviewed  COMPREHENSIVE METABOLIC PANEL WITH GFR - Abnormal; Notable for the following components:      Result Value   Sodium 134 (*)    Potassium 3.3 (*)    Chloride 95 (*)    Calcium 8.8 (*)    Total Bilirubin 4.3 (*)    Anion gap 16 (*)    All other components within normal limits  RESP PANEL BY RT-PCR (RSV, FLU A&B, COVID)  [id number]  LIPASE, BLOOD  CBC WITH DIFFERENTIAL/PLATELET  URINALYSIS, ROUTINE W REFLEX MICROSCOPIC  HEPATIC FUNCTION PANEL    EKG: None  Radiology: No results found.   Procedures   Medications Ordered in the ED  ondansetron  (ZOFRAN ) injection 4 mg (has no administration in time range)  sodium chloride  0.9 % bolus 1,000 mL (has no administration in  time range)  pantoprazole  (PROTONIX ) injection 40 mg (has no administration in time range)                                    Medical Decision Making This patient presents to the ED for concern of congestion/nausea/vomiting, this involves an extensive number of treatment options, and is a complaint that carries with it a high risk of complications and morbidity.  The differential diagnosis includes COVID/flu/RSV, other viral illness, gastritis, cannabinoid hyperemesis, pancreatitis   Co morbidities that complicate the patient evaluation  History of cannabinoid hyperemesis, recent fracture   Additional history obtained:  Additional history obtained from record  review External records from outside source obtained and reviewed including prior ED notes   Lab Tests:  I Ordered, and personally interpreted labs.  The pertinent results include: CBC within normal limits.  CMP notable for mild hyponatremia with sodium of 134, mild hypokalemia with potassium of 3.3, total bilirubin is elevated as compared to baseline, 4.3 today with 1.9 nine months ago, anion gap elevated at 16.  Will check hepatic function panel for indirect/direct bilirubin.  Lipase within normal limits.   Cardiac Monitoring: / EKG:  The patient was maintained on a cardiac monitor.  I personally viewed and interpreted the cardiac monitored which showed an underlying rhythm of: sinus bradycardia   Problem List / ED Course / Critical interventions / Medication management  I ordered medication including Zofran  for nausea, Pepcid  for GI cocktail, IV fluids for rehydration Reevaluation of the patient after these medicines showed that the patient improved I have reviewed the patients home medicines and have made adjustments as needed   Social Determinants of Health:  Former smoker   Test / Admission - Considered:  History of cannabinoid hyperemesis, I have a suspicion that this may be contributing to his symptoms today as he did smoke marijuana 3 days ago prior to onset of symptoms.  Patient is actively vomiting when I enter the room, physical exam is largely unremarkable as above. At time of my reassessment, patient has received Pepcid /Zofran  as well as IV fluids, he is no longer vomiting and reports improvement in his symptoms.  Discussed avoidance of marijuana, as this may be contributing to his symptoms.  Labs are notable as above, hepatic function panel pending at time of shift change.  Patient would likely benefit from gastroenterology referral given total bilirubin elevation, ultrasound results pending at time of shift change.  Signed out to Dr. Garrick at shift change with hepatic  function panel, RUQ US , dispo pending    Amount and/or Complexity of Data Reviewed Labs: ordered. Radiology: ordered.  Risk Prescription drug management.       Final diagnoses:  Nausea and vomiting, unspecified vomiting type    ED Discharge Orders     None          Glendia Rocky LOISE DEVONNA 04/07/24 1941    Garrick Charleston, MD 04/07/24 2132

## 2024-04-07 NOTE — Discharge Instructions (Signed)
 As discussed, your evaluation today has been largely reassuring.  But, it is important that you monitor your condition carefully, and do not hesitate to return to the ED if you develop new, or concerning changes in your condition. ? ?Otherwise, please follow-up with your physician for appropriate ongoing care. ? ?

## 2024-04-07 NOTE — ED Notes (Signed)
 Patient d/c with home care instructions. IV discontinued.

## 2024-04-07 NOTE — ED Triage Notes (Signed)
 Herer by POV from home for vague illness with associated: abd discomfort, NV, general weakness, fatigue, nasal and chest congestion. Denies diarrhea, fever, syncope or bleeding. Describes sx as intermittent. Onset 2-3d ago. V x5 in last 24 hrs.

## 2024-06-19 ENCOUNTER — Emergency Department (HOSPITAL_COMMUNITY)
Admission: EM | Admit: 2024-06-19 | Discharge: 2024-06-19 | Disposition: A | Discharge status: Home / Self Care | Payer: Medicare (Managed Care) | Attending: Emergency Medicine | Admitting: Emergency Medicine

## 2024-06-19 ENCOUNTER — Emergency Department (HOSPITAL_COMMUNITY): Payer: Medicare (Managed Care)

## 2024-06-19 DIAGNOSIS — R110 Nausea: Secondary | ICD-10-CM | POA: Diagnosis not present

## 2024-06-19 DIAGNOSIS — Z79899 Other long term (current) drug therapy: Secondary | ICD-10-CM | POA: Insufficient documentation

## 2024-06-19 DIAGNOSIS — R1084 Generalized abdominal pain: Secondary | ICD-10-CM | POA: Diagnosis not present

## 2024-06-19 DIAGNOSIS — F1290 Cannabis use, unspecified, uncomplicated: Secondary | ICD-10-CM | POA: Diagnosis not present

## 2024-06-19 DIAGNOSIS — R051 Acute cough: Secondary | ICD-10-CM | POA: Insufficient documentation

## 2024-06-19 DIAGNOSIS — R1111 Vomiting without nausea: Secondary | ICD-10-CM | POA: Diagnosis not present

## 2024-06-19 DIAGNOSIS — R059 Cough, unspecified: Secondary | ICD-10-CM | POA: Diagnosis not present

## 2024-06-19 DIAGNOSIS — R0602 Shortness of breath: Secondary | ICD-10-CM | POA: Diagnosis not present

## 2024-06-19 LAB — URINALYSIS, ROUTINE W REFLEX MICROSCOPIC
Bilirubin Urine: NEGATIVE
Glucose, UA: NEGATIVE mg/dL
Hgb urine dipstick: NEGATIVE
Ketones, ur: 20 mg/dL — AB
Leukocytes,Ua: NEGATIVE
Nitrite: NEGATIVE
Protein, ur: 30 mg/dL — AB
Specific Gravity, Urine: 1.017 (ref 1.005–1.030)
pH: 8 (ref 5.0–8.0)

## 2024-06-19 LAB — CBC WITH DIFFERENTIAL/PLATELET
Abs Immature Granulocytes: 0.01 K/uL (ref 0.00–0.07)
Basophils Absolute: 0 K/uL (ref 0.0–0.1)
Basophils Relative: 0 %
Eosinophils Absolute: 0 K/uL (ref 0.0–0.5)
Eosinophils Relative: 0 %
HCT: 39.1 % (ref 39.0–52.0)
Hemoglobin: 13.3 g/dL (ref 13.0–17.0)
Immature Granulocytes: 0 %
Lymphocytes Relative: 13 %
Lymphs Abs: 0.7 K/uL (ref 0.7–4.0)
MCH: 29.7 pg (ref 26.0–34.0)
MCHC: 34 g/dL (ref 30.0–36.0)
MCV: 87.3 fL (ref 80.0–100.0)
Monocytes Absolute: 0.2 K/uL (ref 0.1–1.0)
Monocytes Relative: 3 %
Neutro Abs: 4.2 K/uL (ref 1.7–7.7)
Neutrophils Relative %: 84 %
Platelets: 220 K/uL (ref 150–400)
RBC: 4.48 MIL/uL (ref 4.22–5.81)
RDW: 14 % (ref 11.5–15.5)
WBC: 5 K/uL (ref 4.0–10.5)
nRBC: 0 % (ref 0.0–0.2)

## 2024-06-19 LAB — COMPREHENSIVE METABOLIC PANEL WITH GFR
ALT: 19 U/L (ref 0–44)
AST: 33 U/L (ref 15–41)
Albumin: 4.4 g/dL (ref 3.5–5.0)
Alkaline Phosphatase: 72 U/L (ref 38–126)
Anion gap: 15 (ref 5–15)
BUN: 7 mg/dL — ABNORMAL LOW (ref 8–23)
CO2: 25 mmol/L (ref 22–32)
Calcium: 9.6 mg/dL (ref 8.9–10.3)
Chloride: 106 mmol/L (ref 98–111)
Creatinine, Ser: 0.75 mg/dL (ref 0.61–1.24)
GFR, Estimated: >60 mL/min (ref >60)
Glucose, Bld: 140 mg/dL — ABNORMAL HIGH (ref 70–99)
Potassium: 3.8 mmol/L (ref 3.5–5.1)
Sodium: 146 mmol/L — ABNORMAL HIGH (ref 135–145)
Total Bilirubin: 1.6 mg/dL — ABNORMAL HIGH (ref 0.0–1.2)
Total Protein: 7 g/dL (ref 6.5–8.1)

## 2024-06-19 LAB — RESP PANEL BY RT-PCR (RSV, FLU A&B, COVID)  RVPGX2
Influenza A by PCR: NEGATIVE
Influenza B by PCR: NEGATIVE
Resp Syncytial Virus by PCR: NEGATIVE
SARS Coronavirus 2 by RT PCR: NEGATIVE

## 2024-06-19 LAB — URINE DRUG SCREEN
Amphetamines: NEGATIVE
Barbiturates: NEGATIVE
Benzodiazepines: NEGATIVE
Cocaine: NEGATIVE
Fentanyl: NEGATIVE
Methadone Scn, Ur: NEGATIVE
Opiates: NEGATIVE
Tetrahydrocannabinol: POSITIVE — AB

## 2024-06-19 LAB — LIPASE, BLOOD: Lipase: 27 U/L (ref 11–51)

## 2024-06-19 MED ORDER — ONDANSETRON HCL 4 MG/2ML IJ SOLN
4.0000 mg | Freq: Once | INTRAMUSCULAR | Status: AC
  Administered 2024-06-19: 4 mg via INTRAVENOUS
  Filled 2024-06-19: qty 2

## 2024-06-19 MED ORDER — MORPHINE SULFATE (PF) 4 MG/ML IV SOLN
4.0000 mg | Freq: Once | INTRAVENOUS | Status: AC
  Administered 2024-06-19: 4 mg via INTRAVENOUS
  Filled 2024-06-19: qty 1

## 2024-06-19 MED ORDER — SODIUM CHLORIDE 0.9 % IV BOLUS
1000.0000 mL | Freq: Once | INTRAVENOUS | Status: AC
  Administered 2024-06-19: 1000 mL via INTRAVENOUS

## 2024-06-19 NOTE — ED Provider Notes (Signed)
 [place] EMERGENCY DEPARTMENT AT [facility] Provider Note   CSN: [id number] Arrival date & time: 06/19/24  1631     Patient presents with: Cough   Thomas Hinton is a 68 y.o. male.   68 year old male with a past medical history of paralysis cannabinoid presents to the ED with a chief complaint of cough, malaise, nausea which began today.  Patient reports that is a recurrent problem for him.  He does smoke marijuana daily, he states I am sure this is probably the problem.  He did take some over-the-counter medication to help with symptomatic treatment.  He denies any episodes of vomiting.  He does not have any prior surgeries to his abdomen.  No fever, sick contacts, other complaints reported.  The history is provided by the patient.  Cough      Prior to Admission medications   Medication Sig Start Date End Date Taking? Authorizing Provider  HYDROcodone -acetaminophen  (NORCO/VICODIN) 5-325 MG tablet Take 1 tablet by mouth every 4 (four) hours as needed for severe pain (pain score 7-10). 02/13/24   Freddi Hamilton, MD  ondansetron  (ZOFRAN -ODT) 4 MG disintegrating tablet Take 1 tablet (4 mg total) by mouth every 8 (eight) hours as needed for nausea or vomiting. 06/30/23   Freddi Hamilton, MD  pantoprazole  (PROTONIX ) 40 MG tablet Take 1 tablet (40 mg total) by mouth daily. 06/30/23   Freddi Hamilton, MD  sucralfate  (CARAFATE ) 1 g tablet Take 1 tablet (1 g total) by mouth 4 (four) times daily -  with meals and at bedtime. 06/30/23   Freddi Hamilton, MD    Allergies: Penicillins    Review of Systems  Respiratory:  Positive for cough.     Updated Vital Signs BP (!) 146/79 (BP Location: Left Arm)   Pulse (!) 53   Temp 99.1 F (37.3 C) (Oral)   Resp 15   SpO2 100%   Physical Exam  (all labs ordered are listed, but only abnormal results are displayed) Labs Reviewed  COMPREHENSIVE METABOLIC PANEL WITH GFR - Abnormal; Notable for the following components:       Result Value   Sodium 146 (*)    Glucose, Bld 140 (*)    BUN 7 (*)    Total Bilirubin 1.6 (*)    All other components within normal limits  URINALYSIS, ROUTINE W REFLEX MICROSCOPIC - Abnormal; Notable for the following components:   APPearance HAZY (*)    Ketones, ur 20 (*)    Protein, ur 30 (*)    Bacteria, UA MANY (*)    All other components within normal limits  URINE DRUG SCREEN - Abnormal; Notable for the following components:   Tetrahydrocannabinol POSITIVE (*)    All other components within normal limits  RESP PANEL BY RT-PCR (RSV, FLU A&B, COVID)  [id number]  CBC WITH DIFFERENTIAL/PLATELET  LIPASE, BLOOD    EKG: None  Radiology: DG Chest 2 View Result Date: 06/19/2024 CLINICAL DATA:  cough EXAM: CHEST - 2 VIEW COMPARISON:  06/30/2023 FINDINGS: No focal airspace consolidation, pleural effusion, or pneumothorax. No cardiomegaly. Tortuous aorta with aortic atherosclerosis. No acute fracture or destructive lesions. Multilevel thoracic osteophytosis. IMPRESSION: No acute cardiopulmonary abnormality. Electronically Signed   By: Rogelia Myers M.D.   On: 06/19/2024 17:27     Procedures   Medications Ordered in the ED  ondansetron  (ZOFRAN ) injection 4 mg (4 mg Intravenous Given 06/19/24 1858)  morphine  (PF) 4 MG/ML injection 4 mg (4 mg Intravenous Given 06/19/24 1858)  sodium chloride  0.9 %  bolus 1,000 mL (1,000 mLs Intravenous New Bag/Given 06/19/24 2031)    Clinical Course as of 06/19/24 2257  Wed Jun 19, 2024  2106 Bacteria, UA(!): MANY [JS]  2106 Tetrahydrocannabinol(!): POSITIVE [JS]    Clinical Course User Index [JS] Hadley Soileau, PA-C                                 Medical Decision Making Amount and/or Complexity of Data Reviewed Labs: ordered. Decision-making details documented in ED Course. Radiology: ordered.  Risk Prescription drug management.   This patient presents to the ED for concern of abdominal pain, this involves a number of treatment options,  and is a complaint that carries with it a high risk of complications and morbidity.  The differential diagnosis includes obstruction, appendicitis, cholecystitis.   Co morbidities: Discussed in HPI   Brief History:  See HPI  EMR reviewed including pt PMHx, past surgical history and past visits to ER.   See HPI for more details   Lab Tests:  I ordered and independently interpreted labs.  The pertinent results include:    I personally reviewed all laboratory work and imaging. Metabolic panel without any acute abnormality specifically kidney function within normal limits and no significant electrolyte abnormalities. CBC without leukocytosis or significant anemia.  Patient was within normal limits.  UA with many bacteria, 0-5 white blood cell count.  This is positive for THC.  Respiratory panel is negative for COVID-19, influenza A, RSV.   Imaging Studies:  NAD. I personally reviewed all imaging studies and no acute abnormality found. I agree with radiology interpretation.  Medicines ordered:  I ordered medication including bolus, Zofran , morphine  for symptomatic treatment Reevaluation of the patient after these medicines showed that the patient improved I have reviewed the patients home medicines and have made adjustments as needed  Reevaluation:  After the interventions noted above I re-evaluated patient and found that they have :improved  Social Determinants of Health:  The patient's social determinants of health were a factor in the care of this patient  Problem List / ED Course:  Patient present to the ED with a chief complaint of abdominal pain, cough, body aches which began today.  Does endorse THC use daily.  He has been here multiple times for the same complaint.  Tells me that I am sure this is the problem causing my nausea .  Vitals are within normal limits, does have labs that are within normal limits.  CBC with no leukocytosis, hemoglobin is within normal limits.   CMP with slight increase in sodium, glucose is slightly elevated.  Creatinine levels unremarkable.  LFTs are within normal limits.  Lipase level normal.  Abdomen is benign, he has not had any episodes of vomiting or nausea while in the ED.  His vitals have been within normal limits.  He received Zofran , morphine , bolus with improvement in his symptoms. Tells me he had a cough, no chest pain or shortness of breath.  Chest x-ray obtained did not show any signs of infiltrate.  Respiratory panel is negative for influenza, COVID-19, RSV.  UA with many bacteria, 0-5 white blood cell count.  I do not suspect infection at this time.  UDS is positive for THC, I do suspect that this is likely attributing to his symptoms.  Serial abdominal exams remain benign.  Discussed discontinuing marijuana, return to the emergency department if symptoms worsen.  Did not suspect any further emergent  workup at this time.  Patient is hemodynamically stable for discharge.   Dispostion:  After consideration of the diagnostic results and the patients response to treatment, I feel that the patent would benefit from discontinue marijuana use, return for worsening symptoms.   Portions of this note were generated with Scientist, clinical (histocompatibility and immunogenetics). Dictation errors may occur despite best attempts at proofreading.   Final diagnoses:  Acute cough  Generalized abdominal pain    ED Discharge Orders     None          Maureen Broad, PA-C 06/19/24 2257    Dreama Longs, MD 06/20/24 1406

## 2024-06-19 NOTE — ED Notes (Signed)
 Pt is aware a urine sample is needed. Urinal is at bedside. He stated he could not go at this time.

## 2024-06-19 NOTE — ED Triage Notes (Signed)
 Pt BIB [facility] from home with reports of cough, malaise, and nausea.

## 2024-06-19 NOTE — Discharge Instructions (Signed)
 Your laboratories also are within normal limits today.  Your chest x-ray did not show any signs of pneumonia.  Please discontinue marijuana use as this might be worsening your condition.
